# Patient Record
Sex: Female | Born: 1945
Health system: Southern US, Community
[De-identification: ages and names within clinical notes are randomized; demographics above are authoritative.]

## PROBLEM LIST (undated history)

## (undated) DIAGNOSIS — E079 Disorder of thyroid, unspecified: Secondary | ICD-10-CM

## (undated) DIAGNOSIS — T7840XA Allergy, unspecified, initial encounter: Secondary | ICD-10-CM

## (undated) DIAGNOSIS — M1990 Unspecified osteoarthritis, unspecified site: Secondary | ICD-10-CM

## (undated) DIAGNOSIS — C801 Malignant (primary) neoplasm, unspecified: Secondary | ICD-10-CM

## (undated) DIAGNOSIS — J45909 Unspecified asthma, uncomplicated: Secondary | ICD-10-CM

## (undated) DIAGNOSIS — F329 Major depressive disorder, single episode, unspecified: Secondary | ICD-10-CM

## (undated) DIAGNOSIS — F32A Depression, unspecified: Secondary | ICD-10-CM

## (undated) HISTORY — DX: Depression, unspecified: F32.A

## (undated) HISTORY — PX: TONSILLECTOMY: SUR1361

## (undated) HISTORY — DX: Malignant (primary) neoplasm, unspecified: C80.1

## (undated) HISTORY — DX: Unspecified osteoarthritis, unspecified site: M19.90

## (undated) HISTORY — DX: Allergy, unspecified, initial encounter: T78.40XA

## (undated) HISTORY — PX: HAND SURGERY: SHX662

## (undated) HISTORY — DX: Unspecified asthma, uncomplicated: J45.909

## (undated) HISTORY — PX: FINGER SURGERY: SHX640

## (undated) HISTORY — DX: Major depressive disorder, single episode, unspecified: F32.9

## (undated) HISTORY — DX: Disorder of thyroid, unspecified: E07.9

## (undated) HISTORY — PX: ANKLE FRACTURE SURGERY: SHX122

---

## 2000-01-04 ENCOUNTER — Encounter: Admission: RE | Admit: 2000-01-04 | Discharge: 2000-01-04 | Payer: Self-pay | Admitting: Obstetrics and Gynecology

## 2000-01-04 ENCOUNTER — Encounter: Payer: Self-pay | Admitting: Obstetrics and Gynecology

## 2000-02-07 ENCOUNTER — Encounter: Admission: RE | Admit: 2000-02-07 | Discharge: 2000-02-07 | Payer: Self-pay | Admitting: *Deleted

## 2000-02-07 ENCOUNTER — Encounter: Payer: Self-pay | Admitting: *Deleted

## 2001-01-04 ENCOUNTER — Encounter: Payer: Self-pay | Admitting: Family Medicine

## 2001-01-04 ENCOUNTER — Encounter: Admission: RE | Admit: 2001-01-04 | Discharge: 2001-01-04 | Payer: Self-pay | Admitting: Family Medicine

## 2001-06-29 ENCOUNTER — Other Ambulatory Visit: Admission: RE | Admit: 2001-06-29 | Discharge: 2001-06-29 | Payer: Self-pay | Admitting: Family Medicine

## 2002-08-20 ENCOUNTER — Other Ambulatory Visit: Admission: RE | Admit: 2002-08-20 | Discharge: 2002-08-20 | Payer: Self-pay | Admitting: Family Medicine

## 2002-08-29 ENCOUNTER — Encounter: Admission: RE | Admit: 2002-08-29 | Discharge: 2002-08-29 | Payer: Self-pay | Admitting: Family Medicine

## 2002-08-29 ENCOUNTER — Encounter: Payer: Self-pay | Admitting: Family Medicine

## 2004-07-22 ENCOUNTER — Other Ambulatory Visit: Admission: RE | Admit: 2004-07-22 | Discharge: 2004-07-22 | Payer: Self-pay | Admitting: Family Medicine

## 2004-07-28 ENCOUNTER — Ambulatory Visit: Payer: Self-pay | Admitting: Internal Medicine

## 2004-08-09 ENCOUNTER — Ambulatory Visit: Payer: Self-pay | Admitting: Internal Medicine

## 2008-01-14 ENCOUNTER — Encounter: Admission: RE | Admit: 2008-01-14 | Discharge: 2008-01-14 | Payer: Self-pay | Admitting: Family Medicine

## 2008-07-16 ENCOUNTER — Encounter: Payer: Self-pay | Admitting: Cardiovascular Disease

## 2008-07-23 ENCOUNTER — Encounter: Payer: Self-pay | Admitting: Cardiovascular Disease

## 2008-12-17 DIAGNOSIS — E785 Hyperlipidemia, unspecified: Secondary | ICD-10-CM | POA: Insufficient documentation

## 2008-12-17 DIAGNOSIS — E782 Mixed hyperlipidemia: Secondary | ICD-10-CM | POA: Insufficient documentation

## 2008-12-17 DIAGNOSIS — R002 Palpitations: Secondary | ICD-10-CM | POA: Insufficient documentation

## 2008-12-17 DIAGNOSIS — F329 Major depressive disorder, single episode, unspecified: Secondary | ICD-10-CM | POA: Insufficient documentation

## 2008-12-17 DIAGNOSIS — E039 Hypothyroidism, unspecified: Secondary | ICD-10-CM | POA: Insufficient documentation

## 2008-12-17 DIAGNOSIS — I495 Sick sinus syndrome: Secondary | ICD-10-CM | POA: Insufficient documentation

## 2008-12-17 DIAGNOSIS — T7840XA Allergy, unspecified, initial encounter: Secondary | ICD-10-CM | POA: Insufficient documentation

## 2008-12-17 DIAGNOSIS — G43909 Migraine, unspecified, not intractable, without status migrainosus: Secondary | ICD-10-CM | POA: Insufficient documentation

## 2008-12-17 DIAGNOSIS — R0789 Other chest pain: Secondary | ICD-10-CM | POA: Insufficient documentation

## 2008-12-24 ENCOUNTER — Ambulatory Visit: Payer: Self-pay | Admitting: Cardiovascular Disease

## 2008-12-24 DIAGNOSIS — R9431 Abnormal electrocardiogram [ECG] [EKG]: Secondary | ICD-10-CM | POA: Insufficient documentation

## 2008-12-24 DIAGNOSIS — R42 Dizziness and giddiness: Secondary | ICD-10-CM | POA: Insufficient documentation

## 2008-12-25 ENCOUNTER — Telehealth: Payer: Self-pay | Admitting: Cardiology

## 2009-02-28 ENCOUNTER — Inpatient Hospital Stay (HOSPITAL_COMMUNITY): Admission: EM | Admit: 2009-02-28 | Discharge: 2009-03-04 | Payer: Self-pay | Admitting: Emergency Medicine

## 2009-08-05 ENCOUNTER — Ambulatory Visit: Payer: Self-pay | Admitting: Sports Medicine

## 2009-08-05 DIAGNOSIS — M1710 Unilateral primary osteoarthritis, unspecified knee: Secondary | ICD-10-CM

## 2009-08-05 DIAGNOSIS — IMO0002 Reserved for concepts with insufficient information to code with codable children: Secondary | ICD-10-CM | POA: Insufficient documentation

## 2009-08-25 ENCOUNTER — Ambulatory Visit: Payer: Self-pay | Admitting: Family Medicine

## 2009-08-25 DIAGNOSIS — IMO0002 Reserved for concepts with insufficient information to code with codable children: Secondary | ICD-10-CM | POA: Insufficient documentation

## 2009-09-22 ENCOUNTER — Ambulatory Visit: Payer: Self-pay | Admitting: Family Medicine

## 2009-11-26 ENCOUNTER — Encounter: Payer: Self-pay | Admitting: Cardiovascular Disease

## 2009-12-03 ENCOUNTER — Encounter: Payer: Self-pay | Admitting: Cardiovascular Disease

## 2009-12-30 ENCOUNTER — Ambulatory Visit: Payer: Self-pay | Admitting: Cardiovascular Disease

## 2010-01-14 ENCOUNTER — Telehealth (INDEPENDENT_AMBULATORY_CARE_PROVIDER_SITE_OTHER): Payer: Self-pay | Admitting: *Deleted

## 2010-01-18 ENCOUNTER — Encounter: Payer: Self-pay | Admitting: Cardiovascular Disease

## 2010-01-18 ENCOUNTER — Ambulatory Visit: Payer: Self-pay

## 2010-01-18 ENCOUNTER — Ambulatory Visit (HOSPITAL_COMMUNITY): Admission: RE | Admit: 2010-01-18 | Discharge: 2010-01-18 | Payer: Self-pay | Admitting: Cardiovascular Disease

## 2010-01-18 ENCOUNTER — Ambulatory Visit: Payer: Self-pay | Admitting: Internal Medicine

## 2010-02-10 ENCOUNTER — Ambulatory Visit: Payer: Self-pay | Admitting: Cardiovascular Disease

## 2010-03-08 ENCOUNTER — Telehealth (INDEPENDENT_AMBULATORY_CARE_PROVIDER_SITE_OTHER): Payer: Self-pay | Admitting: *Deleted

## 2010-04-29 NOTE — Progress Notes (Signed)
Summary: [facility]   [facility]   Imported By: Roderic Ovens 01/08/2010 15:16:09  _____________________________________________________________________  External Attachment:    Type:   Image     Comment:   External Document

## 2010-04-29 NOTE — Progress Notes (Signed)
Summary: Records Request  Faxed Echo. to Angie at [facility]. at [facility] ([phone]).  Debby Freiberg  March 08, 2010 1:59 PM

## 2010-04-29 NOTE — Assessment & Plan Note (Signed)
Summary: 1 month rov.sl   Visit Type:  1 mko f/u Primary Provider:  Murrell Redden, MD  CC:  sob w/inclines....pt denies any other cardiac complaints today.  History of Present Illness: 65 yo WF with history of migraine headaches, hypothyroidism, skin cancer here today for follow up.I saw her 6 weeks ago and she complained of chest pain at a meeting.   She was asked to talk at this meeting and she felt a weight on her chest. There was associated dizziness and pressure in her head. The pressure in her chest subsided 15 minutes later. There had been no exertional chest pain or pressure. She does have exertional dyspnea when climbing stairs. I ordered a stress echo. She is here today to review the results of the stress test. She tells me that she has had no recurrence of chest pain. She continues to have mild SOB when walking up stairs or inclines. She did have mold in her home discooverd recently. She has a mild cough.    Current Medications (verified): 1)  Atenolol 50 Mg Tabs (Atenolol) .... Take One Tablet By Mouth Daily 2)  Armour Thyroid 30 Mg Tabs (Thyroid) .... Take One Tablet By Mouth Once Daily. 3)  Armour Thyroid 60 Mg Tabs (Thyroid) .... Take One Tablet By Mouth Once Daily. 4)  Garlic Oil 1000 Mg Caps (Garlic) .... Take One Tablet By Mouth Twice Daily. 5)  Acidophilus 10 Mg Caps (Lactobacillus) .... Take One Tablet By Mouth Once Daily. 6)  Fish Oil 1000 Mg Caps (Omega-3 Fatty Acids) .Marland Kitchen.. 1 Cap Once Daily 7)  Calcium/magnesium/zinc Formula Tabs (Calcium-Magnesium-Zinc) .Marland Kitchen.. 1 Tab Once Daily 8)  Vitamin D 1000 Unit  Tabs (Cholecalciferol) .... Take One Tablet By Mouth Once Daily. 9)  Cvs Vitamin E 400 Unit Caps (Vitamin E) .... Take One Tablet By Mouth Once Daily. 10)  Holy Basil Extract 450mg  .... Take One Tablet By Mouth Once Daily. 11)  Silymarin  Caps (Milk Thistle-Turmeric) .Marland Kitchen.. 1 Cap Once Daily 12)  Citalopram Hydrobromide 40 Mg Tabs (Citalopram Hydrobromide) .Marland Kitchen.. 1 Tab Once  Daily  Allergies (verified): No Known Drug Allergies  Past History:  Past Medical History: Reviewed history from 12/24/2008 and no changes required. RBBB/ INCOMPLETE (ICD-426.4) SINUS BRADYCARDIA (ICD-427.81) CHEST PAIN, ATYPICAL (ICD-786.59) DEPRESSION (ICD-311) HYPOTHYROIDISM (ICD-244.9) ALLERGY (ICD-995.3) MIGRAINE HEADACHE (ICD-346.90) Asthma Arthritis    Social History: Reviewed history from 12/24/2008 and no changes required. Tobacco Use - No.  No illicit drugs Social alcohol Separated 2 children  Review of Systems       The patient complains of shortness of breath.  The patient denies fatigue, malaise, fever, weight gain/loss, vision loss, decreased hearing, hoarseness, chest pain, palpitations, prolonged cough, wheezing, sleep apnea, coughing up blood, abdominal pain, blood in stool, nausea, vomiting, diarrhea, heartburn, incontinence, blood in urine, muscle weakness, joint pain, leg swelling, rash, skin lesions, headache, fainting, dizziness, depression, anxiety, enlarged lymph nodes, easy bruising or bleeding, and environmental allergies.    Vital Signs:  Patient profile:   65 year old female Height:      63 inches Weight:      157 pounds BMI:     27.91 Pulse rate:   62 / minute Pulse rhythm:   irregular BP sitting:   98 / 66  (left arm) Cuff size:   large  Vitals Entered By: [name], CMA (February 10, 2010 2:34 PM)  Physical Exam  General:  General: Well developed, well nourished, NAD Musculoskeletal: Muscle strength 5/5 all ext Psychiatric: Mood  and affect normal Lungs:Clear bilaterally, no wheezes, rhonci, crackles CV: RRR no murmurs, gallops rubs Abdomen: soft, NT, ND, BS present Extremities: No edema, pulses 2+.    Stress Echocardiogram  Procedure date:  01/18/2010  Findings:      Good exercise tolerance. (7 min 58 sec). Normal LV function before and after exercise. No ischemic EKG changes.   Impression & Recommendations:  Problem #  1:  CHEST PAIN, ATYPICAL (ICD-786.59) Stress echo without evidence of ischemia. No further cardiac workup necessary. Her episode of chest pain was most likely anxiety related.   Her updated medication list for this problem includes:    Atenolol 50 Mg Tabs (Atenolol) .Marland Kitchen... Take one tablet by mouth daily  Patient Instructions: 1)  Your physician recommends that you schedule a follow-up appointment in: as needed.

## 2010-04-29 NOTE — Assessment & Plan Note (Signed)
Summary: F/U,MC   Vital Signs:  Patient profile:   65 year old female BP sitting:   115 / 76  Vitals Entered By: Lillia Pauls CMA (September 22, 2009 3:19 PM)  History of Present Illness: 65 year old female recently seen by Dr. Darrick Penna with L knee pain s/p corticosteroid injection last month, now in f/u  at this point, she is only having pain at the pes anserine bursitis  on the left side, and she is now approximately 80% better. She is not having any intra-articular pain at this point.  REVIEW OF SYSTEMS  GEN: No systemic complaints, no fevers, chills, sweats, or other acute illnesses MSK: Detailed in the HPI GI: tolerating PO intake without difficulty Neuro: No numbness, parasthesias, or tingling associated. Otherwise the pertinent positives of the ROS are noted above.    GEN: Well-developed,well-nourished,in no acute distress; alert,appropriate and cooperative throughout examination HEENT: Normocephalic and atraumatic without obvious abnormalities. No apparent alopecia or balding. Ears, externally no deformities PULM: Breathing comfortably in no respiratory distress EXT: No clubbing, cyanosis, or edema PSYCH: Normally interactive. Cooperative during the interview. Pleasant. Friendly and conversant. Not anxious or depressed appearing. Normal, full affect.   Left knee: Full range of motion. Nontender throughout range of motion. Nontender on the joint lines. She is tender at thepes anserine bursitis, but only mildly  No tenderness at the patellar tendon or quadriceps tendon.  Allergies: No Known Drug Allergies   Impression & Recommendations:  Problem # 1:  ANSERINE BURSITIS, LEFT (ICD-726.61) mostly improved pes anserine bursitis. Continue with strengthening, range of motion, and icing as needed.  Follow up as needed  Complete Medication List: 1)  Atenolol 50 Mg Tabs (Atenolol) .... Take one tablet by mouth daily 2)  Armour Thyroid 30 Mg Tabs (Thyroid) .... Take one tablet by  mouth once daily. 3)  Armour Thyroid 60 Mg Tabs (Thyroid) .... Take one tablet by mouth once daily. 4)  Garlic Oil 1000 Mg Caps (Garlic) .... Take one tablet by mouth twice daily. 5)  Acidophilus 10 Mg Caps (Lactobacillus) .... Take one tablet by mouth once daily. 6)  Fish Oil Oil (Fish oil) .... Take one tablet by mouth once daily. 7)  Calcium/magnesium/zinc Formula Tabs (calcium-magnesium-zinc)  8)  Vitamin D 1000 Unit Tabs (Cholecalciferol) .... Take one tablet by mouth once daily. 9)  Cvs Vitamin E 400 Unit Caps (Vitamin e) .... Take one tablet by mouth once daily. 10)  Holy Basil Extract 450mg   .... Take one tablet by mouth once daily.

## 2010-04-29 NOTE — Assessment & Plan Note (Signed)
Summary: KNEE PAIN,MC   Vital Signs:  Patient profile:   65 year old female BP sitting:   112 / 71  Vitals Entered By: Lillia Pauls CMA (Aug 25, 2009 2:04 PM)  History of Present Illness: 65 year old female recently seen by Dr. Darrick Penna with L knee pain s/p corticosteroid injection several weeks ago here in follow-up.  Has had a lot of pain, injection a couple of weeks ago, felt really good for about three days. Then pain up on the bottom. Now pain is in the lower tibial area.    now patient is having the region just below her joint line about 2 inches below  the painful. There is mild swelling. She has not had any specific trauma or injury that she can recall. She go to a wedding this weekend in [place].  REVIEW OF SYSTEMS  GEN: No systemic complaints, no fevers, chills, sweats, or other acute illnesses MSK: Detailed in the HPI GI: tolerating PO intake without difficulty Neuro: No numbness, parasthesias, or tingling associated. Otherwise the pertinent positives of the ROS are noted above.    GEN: Well-developed,well-nourished,in no acute distress; alert,appropriate and cooperative throughout examination HEENT: Normocephalic and atraumatic without obvious abnormalities. No apparent alopecia or balding. Ears, externally no deformities PULM: Breathing comfortably in no respiratory distress EXT: No clubbing, cyanosis, or edema PSYCH: Normally interactive. Cooperative during the interview. Pleasant. Friendly and conversant. Not anxious or depressed appearing. Normal, full affect.   Left knee: Full range of motion. Nontender throughout range of motion. Nontender on the joint lines. She is tender at the present serene bursa, and does have pain with  resisted external rotation hip with her knee bend at 90.  No tenderness at the patellar tendon or quadriceps tendon.  Allergies: No Known Drug Allergies   Impression & Recommendations:  Problem # 1:  ANSERINE BURSITIS, LEFT  (ICD-726.61) Assessment New think this is all secondary likely due to gait disturbance.  Think improbable to  any relation to her intra-articular involvement.  Status post corticosteroid injection approximately 10 days ago. No history of trauma.  Less likely, could be stress reaction in the tibial plateau, however she is mostly tender right at the pes anserine bursitis  Management certainly, anti-inflammatories, and ice massage.  Reviewed range of motion and of all the muscles involved at this insertion point. Discussed use of Voltaren gel but  the patient is using in  herbal and inflammatory product from [place] with some success, so I thought that she could do this without any potential harm and continue conservatively. f/u 4 weeks  Complete Medication List: 1)  Atenolol 50 Mg Tabs (Atenolol) .... Take one tablet by mouth daily 2)  Armour Thyroid 30 Mg Tabs (Thyroid) .... Take one tablet by mouth once daily. 3)  Armour Thyroid 60 Mg Tabs (Thyroid) .... Take one tablet by mouth once daily. 4)  Garlic Oil 1000 Mg Caps (Garlic) .... Take one tablet by mouth twice daily. 5)  Acidophilus 10 Mg Caps (Lactobacillus) .... Take one tablet by mouth once daily. 6)  Fish Oil Oil (Fish oil) .... Take one tablet by mouth once daily. 7)  Calcium/magnesium/zinc Formula Tabs (calcium-magnesium-zinc)  8)  Vitamin D 1000 Unit Tabs (Cholecalciferol) .... Take one tablet by mouth once daily. 9)  Cvs Vitamin E 400 Unit Caps (Vitamin e) .... Take one tablet by mouth once daily. 10)  Holy Basil Extract 450mg   .... Take one tablet by mouth once daily.

## 2010-04-29 NOTE — Letter (Signed)
Summary: [facility] Referral form  [facility] Referral form   Imported By: Marily Memos 08/05/2009 10:37:50  _____________________________________________________________________  External Attachment:    Type:   Image     Comment:   External Document

## 2010-04-29 NOTE — Assessment & Plan Note (Signed)
Summary: NP KNEE PAIN/MJD   Vital Signs:  Patient profile:   65 year old female BP sitting:   130 / 80  Vitals Entered By: Lillia Pauls CMA (Aug 05, 2009 8:58 AM)  Primary Provider:  Murrell Redden, MD   History of Present Illness: Hx of left knee DJD. Previously followed at [facility]. Last received a corticosteroid injection 3 yrs ago with signficant pain relief. No left knee injuries or surgeries. No resting pain or paresthesias. Pain worst on prolonged walking, deep flexion, and stair ascension. Pain and swelling have gradually increased over past several weeks. No inciting event. Pain level of 8/10 recently. No locking/popping/catching/buckling.  note has done yoga w nancy thrornton and would like to return to this if possible  advised she may need to modify knee bend to accomplish this once pain settles  Allergies: No Known Drug Allergies  Physical Exam  General:  Well-developed,well-nourished,in no acute distress; alert,appropriate and cooperative throughout examination Msk:  Left KNEE: Mild diffuse swelling. No discoloration or increased warmth. TTP worst along supero-lateral aspect of patella. Less ttp of med joint line and pes bursa. Active ROM of 0 to 100 with pain on end flexion. No signs of locking on exam. Normal ligament stability. (-) McMurray's though pt slightly guarding. Normal nv examination.   GAIT: No functional leg leg discrepancy. Slightly favoring LLE today. Extremities:  Equal leg lengths.   Impression & Recommendations:  Problem # 1:  DEGENERATIVE JOINT DISEASE, KNEE (ICD-715.96) Possible underlying meniscal tear though no frank signs on exam.  After obtaining informed verbal consent from the patient, the infero-lateral aspect of the anterior knee was prepped with alcohol and betadine. Ethyl chloride was used to anesthetize the skin. A 11ml:2ml mixture of lidocaine and kenalog 40mg /ml was injected into the  infero-lateral aspect of the anterior left knee without complications or difficulty. The patient tolerated this procedure well.  - Formal Physical Therapy. - Daily SLR at home. - Focus more on recumbent biking and elliptical. - Supportive shoes. - Caution regarding physical activities which significantly load the knees. - Immediately seek MD attention for fever, knee discoloration, increased pain/swelling, or any other concerns. Otherwise RTC in 8 wks.  Orders: Joint Aspirate / Injection, Large (20610) Kenalog 10 mg inj (J3301)  Complete Medication List: 1)  Atenolol 50 Mg Tabs (Atenolol) .... Take one tablet by mouth daily 2)  Armour Thyroid 30 Mg Tabs (Thyroid) .... Take one tablet by mouth once daily. 3)  Armour Thyroid 60 Mg Tabs (Thyroid) .... Take one tablet by mouth once daily. 4)  Garlic Oil 1000 Mg Caps (Garlic) .... Take one tablet by mouth twice daily. 5)  Acidophilus 10 Mg Caps (Lactobacillus) .... Take one tablet by mouth once daily. 6)  Fish Oil Oil (Fish oil) .... Take one tablet by mouth once daily. 7)  Calcium/magnesium/zinc Formula Tabs (calcium-magnesium-zinc)  8)  Vitamin D 1000 Unit Tabs (Cholecalciferol) .... Take one tablet by mouth once daily. 9)  Cvs Vitamin E 400 Unit Caps (Vitamin e) .... Take one tablet by mouth once daily. 10)  Holy Basil Extract 450mg   .... Take one tablet by mouth once daily.

## 2010-04-29 NOTE — Assessment & Plan Note (Signed)
Summary: ROV/CHEST PAIN/JML   Visit Type:  rov Primary Sheri Ray:  Sheri Redden, MD  CC:  chest pressure...sob....denies any edema.  History of Present Illness: 65 yo WF with history of migraine headaches, hypothyroidism, skin cancer here today for follow up. She was seen last year after an episode of chest pain. She was doing her activities of daily living and became dizzy, mild chest pressure that lasted for 30 seconds, associated with mild SOB but no dizziness, diaphoresis or nausea, no radiation. I ordered an echo but she cancelled this appt. She is here today for follow up and tells me that has done well until two weeks ago when she was out eating and felt agitated. She was asked to talk at this meeting and she felt a weight on her chest. There was associated dizziness and pressure in her head. The pressure in her chest subsided 15 minutes later. There has been no exertional chest pain or pressure. She does have exertional dyspnea when climbing stairs.   Current Medications (verified): 1)  Atenolol 50 Mg Tabs (Atenolol) .... Take One Tablet By Mouth Daily 2)  Armour Thyroid 30 Mg Tabs (Thyroid) .... Take One Tablet By Mouth Once Daily. 3)  Armour Thyroid 60 Mg Tabs (Thyroid) .... Take One Tablet By Mouth Once Daily. 4)  Garlic Oil 1000 Mg Caps (Garlic) .... Take One Tablet By Mouth Twice Daily. 5)  Acidophilus 10 Mg Caps (Lactobacillus) .... Take One Tablet By Mouth Once Daily. 6)  Fish Oil   Oil (Fish Oil) .... Take One Tablet By Mouth Once Daily. 7)  Calcium/magnesium/zinc Formula Tabs (Calcium-Magnesium-Zinc) 8)  Vitamin D 1000 Unit  Tabs (Cholecalciferol) .... Take One Tablet By Mouth Once Daily. 9)  Cvs Vitamin E 400 Unit Caps (Vitamin E) .... Take One Tablet By Mouth Once Daily. 10)  Holy Basil Extract 450mg  .... Take One Tablet By Mouth Once Daily.  Allergies (verified): No Known Drug Allergies  Past History:  Past Medical History: Reviewed history from 12/24/2008 and no  changes required. RBBB/ INCOMPLETE (ICD-426.4) SINUS BRADYCARDIA (ICD-427.81) CHEST PAIN, ATYPICAL (ICD-786.59) DEPRESSION (ICD-311) HYPOTHYROIDISM (ICD-244.9) ALLERGY (ICD-995.3) MIGRAINE HEADACHE (ICD-346.90) Asthma Arthritis    Social History: Reviewed history from 12/24/2008 and no changes required. Tobacco Use - No.  No illicit drugs Social alcohol Separated 2 children  Review of Systems       The patient complains of chest pain.  The patient denies fatigue, malaise, fever, weight gain/loss, vision loss, decreased hearing, hoarseness, palpitations, shortness of breath, prolonged cough, wheezing, sleep apnea, coughing up blood, abdominal pain, blood in stool, nausea, vomiting, diarrhea, heartburn, incontinence, blood in urine, muscle weakness, joint pain, leg swelling, rash, skin lesions, headache, fainting, dizziness, depression, anxiety, enlarged lymph nodes, easy bruising or bleeding, and environmental allergies.    Vital Signs:  Patient profile:   65 year old female Height:      63 inches Weight:      151.12 pounds BMI:     26.87 Pulse rate:   59 / minute Pulse rhythm:   irregular BP sitting:   106 / 70  (left arm) Cuff size:   regular  Vitals Entered By: [name], CMA (December 30, 2009 3:37 PM)  Physical Exam  General:  General: Well developed, well nourished, NAD HEENT: OP clear, mucus membranes moist SKIN: warm, dry Neuro: No focal deficits Musculoskeletal: Muscle strength 5/5 all ext Psychiatric: Mood and affect normal Neck: No JVD, no carotid bruits, no thyromegaly, no lymphadenopathy. Lungs:Clear bilaterally, no wheezes,  rhonci, crackles CV: RRR no murmurs, gallops rubs Abdomen: soft, NT, ND, BS present Extremities: No edema, pulses 2+.    EKG  Procedure date:  12/30/2009  Findings:      sinus bradycardia, rate 59 bpm. Incomplete RBBB.   Impression & Recommendations:  Problem # 1:  CHEST PAIN, ATYPICAL (ICD-786.59) Most likely  non-cardiac in etiology. Her father had CAD diagnosed in his 34s. She has been under much stress. Will arrange stress echo to assess fo ischemia.   Her updated medication list for this problem includes:    Atenolol 50 Mg Tabs (Atenolol) .Marland Kitchen... Take one tablet by mouth daily  Orders: EKG w/ Interpretation (93000) Stress Echo (Stress Echo)  Patient Instructions: 1)  Your physician recommends that you schedule a follow-up appointment in: 3-4 weeks. 2)  Your physician recommends that you continue on your current medications as directed. Please refer to the Current Medication list given to you today. 3)  Your physician has requested that you have a stress echocardiogram. For further information please visit [email or web address]  Please follow instruction sheet as given.

## 2010-04-29 NOTE — Progress Notes (Signed)
Summary: Stress Echo Pre-Procedure  Phone Note Outgoing Call   Call placed by: Antionette Char RN,  January 14, 2010 5:13 PM Call placed to: Patient Reason for Call: Confirm/change Appt Summary of Call: Left message with instructions for Stress Echo. Instructions given to hold Atenolol.

## 2010-04-29 NOTE — Progress Notes (Signed)
Summary: [facility] Assessment   [facility] Assessment   Imported By: Roderic Ovens 01/08/2010 15:14:36  _____________________________________________________________________  External Attachment:    Type:   Image     Comment:   External Document

## 2010-06-29 LAB — DIFFERENTIAL
Basophils Absolute: 0 K/uL (ref 0.0–0.1)
Basophils Relative: 0 % (ref 0–1)
Eosinophils Absolute: 0.2 K/uL (ref 0.0–0.7)
Eosinophils Relative: 4 % (ref 0–5)
Lymphocytes Relative: 21 % (ref 12–46)
Lymphs Abs: 1.4 10*3/uL (ref 0.7–4.0)
Monocytes Absolute: 0.4 10*3/uL (ref 0.1–1.0)
Monocytes Relative: 5 % (ref 3–12)
Neutro Abs: 4.5 10*3/uL (ref 1.7–7.7)
Neutrophils Relative %: 69 % (ref 43–77)

## 2010-06-29 LAB — CBC
HCT: 34.1 % — ABNORMAL LOW (ref 36.0–46.0)
HCT: 40.2 % (ref 36.0–46.0)
Hemoglobin: 12 g/dL (ref 12.0–15.0)
Hemoglobin: 13.8 g/dL (ref 12.0–15.0)
MCHC: 34.2 g/dL (ref 30.0–36.0)
MCHC: 35.1 g/dL (ref 30.0–36.0)
MCHC: 35.5 g/dL (ref 30.0–36.0)
MCV: 84.3 fL (ref 78.0–100.0)
MCV: 85.3 fL (ref 78.0–100.0)
Platelets: 180 K/uL (ref 150–400)
RBC: 4.01 MIL/uL (ref 3.87–5.11)
RBC: 4.71 MIL/uL (ref 3.87–5.11)
RDW: 14.1 % (ref 11.5–15.5)
RDW: 14.1 % (ref 11.5–15.5)
RDW: 14.2 % (ref 11.5–15.5)
WBC: 6.5 10*3/uL (ref 4.0–10.5)

## 2010-12-07 ENCOUNTER — Ambulatory Visit (INDEPENDENT_AMBULATORY_CARE_PROVIDER_SITE_OTHER): Payer: BC Managed Care – PPO | Admitting: Sports Medicine

## 2010-12-07 VITALS — BP 115/74 | Ht 62.5 in | Wt 150.0 lb

## 2010-12-07 DIAGNOSIS — M7750 Other enthesopathy of unspecified foot: Secondary | ICD-10-CM

## 2010-12-07 DIAGNOSIS — M79673 Pain in unspecified foot: Secondary | ICD-10-CM | POA: Insufficient documentation

## 2010-12-07 DIAGNOSIS — M7740 Metatarsalgia, unspecified foot: Secondary | ICD-10-CM | POA: Insufficient documentation

## 2010-12-07 DIAGNOSIS — M79609 Pain in unspecified limb: Secondary | ICD-10-CM

## 2010-12-07 NOTE — Progress Notes (Signed)
  Subjective:    Patient ID: Sheri Ray, female    DOB: [birth year], 65 y.o.   MRN: [id number]  HPI For last 6 months, pt has been having numbness on ball of great toe bilaterally. Pt also c/o point tenderness along metatarsals on both feet.  Pt has cramping in her toes.  She has been walking for exercise.  Pt recently bought hard plastic OTC orthotics and did feel better but still has forefoot pain and can't use these in some shoes.  Plans to walk 5 miles with her son Sheri Ray [facility]) this weekend   Review of Systems     Objective:   Physical Exam NAD  R foot:collapse of long arch and transverse arch, Morton's callous; calcaneal valgus on R ; R ankle is subluxed when pt is standing; spurring at 5th MTP; good great toe motion  L Foot:neutral calcaneus, mild breakdown of long arch (not as much as R), loss of transverse arch and Morton's callous; spurring at 5th MTP; good great toe motion       Assessment & Plan:  Pt's nerve pain, cramping and great toe numbness are mostly due to breakdown of pt's R long arch and breakdown of bilateral transverse arch. - pt given green inserts with scaphoid pad on R and metatarsal pads on both feet -pt given add'l metatarsal pads for other shoes  Pt should try these out for 4-6 weeks and if she wants something more permanent, please feel free to return for custom orthotics.

## 2010-12-07 NOTE — Assessment & Plan Note (Signed)
For her walking shoes we made a temporary sports insole with arch support on the right  We also added metatarsal pads to both  She felt better support with this and is going to try to break these in over the next 3 weeks

## 2010-12-07 NOTE — Assessment & Plan Note (Signed)
We tried placing metatarsal pads in a couple of her other shoes  If this strategy works we want to place metatarsal pads and all of her regular shoes

## 2012-08-02 ENCOUNTER — Other Ambulatory Visit: Payer: Self-pay | Admitting: Family Medicine

## 2012-08-02 ENCOUNTER — Other Ambulatory Visit (HOSPITAL_COMMUNITY)
Admission: RE | Admit: 2012-08-02 | Discharge: 2012-08-02 | Disposition: A | Discharge status: Home / Self Care | Payer: Medicare Other | Source: Ambulatory Visit | Attending: Family Medicine | Admitting: Family Medicine

## 2012-08-02 DIAGNOSIS — Z124 Encounter for screening for malignant neoplasm of cervix: Secondary | ICD-10-CM | POA: Insufficient documentation

## 2012-08-02 DIAGNOSIS — Z1151 Encounter for screening for human papillomavirus (HPV): Secondary | ICD-10-CM | POA: Insufficient documentation

## 2013-06-10 ENCOUNTER — Ambulatory Visit (INDEPENDENT_AMBULATORY_CARE_PROVIDER_SITE_OTHER): Payer: Medicare Other | Admitting: Podiatry

## 2013-06-10 ENCOUNTER — Encounter: Payer: Self-pay | Admitting: Podiatry

## 2013-06-10 DIAGNOSIS — Q828 Other specified congenital malformations of skin: Secondary | ICD-10-CM

## 2013-06-10 NOTE — Progress Notes (Signed)
° °  Subjective:    Patient ID: Sheri Ray, female    DOB: 1945-03-31, 68 y.o.   MRN: [id number]  HPI I have these places on the balls of both feet and has been going on for about 5 years and hurts with shoes. This patient presents for ongoing debridement of painful porokeratoses on the right and left feet. The last visit for this similar service was 12/17/2012. She has been a patient in this practice since 1996.    Review of Systems  Constitutional: Positive for fatigue.  Allergic/Immunologic: Positive for food allergies.  Neurological: Positive for headaches.  Hematological: Bruises/bleeds easily.  All other systems reviewed and are negative.       Objective:   Physical Exam  Multiple punctate keratoses on the plantar aspect of right and left feet.          Assessment & Plan:   Assessment: Porokeratoses multiple right and left feet  Plan: Keratoses debrided and packed with salinocaine. Reappoint at patient's request

## 2013-06-17 ENCOUNTER — Ambulatory Visit: Payer: Self-pay | Admitting: Podiatry

## 2013-08-26 ENCOUNTER — Encounter: Payer: Self-pay | Admitting: Podiatry

## 2013-08-26 ENCOUNTER — Ambulatory Visit (INDEPENDENT_AMBULATORY_CARE_PROVIDER_SITE_OTHER): Payer: Medicare Other | Admitting: Podiatry

## 2013-08-26 VITALS — BP 132/74 | HR 55 | Resp 15 | Ht 62.5 in | Wt 142.0 lb

## 2013-08-26 DIAGNOSIS — Q828 Other specified congenital malformations of skin: Secondary | ICD-10-CM

## 2013-08-26 NOTE — Progress Notes (Signed)
Patient ID: Sheri Ray, female   DOB: 1945-07-25, 68 y.o.   MRN: [id number]  Subjective: This patient presents complaining of painful multiple plantar keratoses right and left feet  Objective: Multiple punctate nucleated keratoses plantar right and left feet  Assessment: Porokeratoses multiple  Plan: Debrided the multiple keratoses and packed with salinocaine  Reappoint at patient's request

## 2013-10-28 ENCOUNTER — Encounter: Payer: Self-pay | Admitting: Podiatry

## 2013-10-28 ENCOUNTER — Ambulatory Visit (INDEPENDENT_AMBULATORY_CARE_PROVIDER_SITE_OTHER): Payer: Medicare Other | Admitting: Podiatry

## 2013-10-28 VITALS — BP 123/74 | HR 86 | Resp 12

## 2013-10-28 DIAGNOSIS — Q828 Other specified congenital malformations of skin: Secondary | ICD-10-CM

## 2013-10-29 NOTE — Progress Notes (Signed)
Patient ID: Sheri Ray, female   DOB: 1945-09-25, 68 y.o.   MRN: [id number]  Subjective: This patient presents today complaining of painful nucleated plantar keratoses  Objective: Multiple nucleated plantar keratoses right and left  Assessment: Porokeratosis multiple 2-4  Plan: Debride keratoses and pack with salinocaine   Reappoint at patient's request

## 2014-02-10 ENCOUNTER — Ambulatory Visit (INDEPENDENT_AMBULATORY_CARE_PROVIDER_SITE_OTHER): Payer: Medicare Other | Admitting: Podiatry

## 2014-02-10 ENCOUNTER — Encounter: Payer: Self-pay | Admitting: Podiatry

## 2014-02-10 DIAGNOSIS — Q828 Other specified congenital malformations of skin: Secondary | ICD-10-CM

## 2014-02-11 NOTE — Progress Notes (Signed)
Patient ID: Sheri Ray, female   DOB: 1945/10/25, 68 y.o.   MRN: [id number]  Subjective: This patient presents again complaining of painful multiple nucleated plantar keratoses bilaterally  Objective: Multiple plantar nucleated keratoses right and left  Assessment: Porokeratosis multiple  Plan: Debrided keratoses and packed with salinocaine  Reappoint at patient's request

## 2014-02-19 ENCOUNTER — Ambulatory Visit (INDEPENDENT_AMBULATORY_CARE_PROVIDER_SITE_OTHER): Payer: Medicare Other | Admitting: Podiatry

## 2014-02-19 ENCOUNTER — Encounter: Payer: Self-pay | Admitting: Podiatry

## 2014-02-19 VITALS — BP 110/66 | HR 60 | Resp 12

## 2014-02-19 DIAGNOSIS — Q828 Other specified congenital malformations of skin: Secondary | ICD-10-CM

## 2014-02-19 NOTE — Progress Notes (Signed)
Patient ID: Sheri Ray, female   DOB: 1945/11/08, 68 y.o.   MRN: [id number]  Subjective: This patient presents after the visit of 02/10/2014 for debridement of a nucleated plantar keratoses. She's had minimal relief primarily localized to the plantar left foot  Objective: Residual punctate keratoses plantar second MPJ  Assessment: Porokeratosis 1  Plan: Debrided keratoses 1 in back with salinocaine  Reappoint at patient's request

## 2014-05-07 ENCOUNTER — Ambulatory Visit (INDEPENDENT_AMBULATORY_CARE_PROVIDER_SITE_OTHER): Payer: Medicare Other | Admitting: Podiatry

## 2014-05-07 ENCOUNTER — Encounter: Payer: Self-pay | Admitting: Podiatry

## 2014-05-07 DIAGNOSIS — Q828 Other specified congenital malformations of skin: Secondary | ICD-10-CM

## 2014-05-07 NOTE — Progress Notes (Signed)
   Subjective:    Patient ID: Sheri Ray, female    DOB: Aug 30, 1945, 69 y.o.   MRN: [id number]  HPI Pt presents for painful callus trim, left foot   Review of Systems     Objective:   Physical Exam  Orientated 3 Nucleated plantar keratoses sub-second left MPJ      Assessment & Plan:   Assessment: Porokeratosis 1  Plan: Debridement of keratoses 1 and packed with salinocaine  Reappoint at patient's request

## 2014-06-18 ENCOUNTER — Encounter: Payer: Self-pay | Admitting: Internal Medicine

## 2014-09-01 ENCOUNTER — Encounter: Payer: Self-pay | Admitting: Internal Medicine

## 2014-09-14 ENCOUNTER — Emergency Department (HOSPITAL_COMMUNITY)
Admission: EM | Admit: 2014-09-14 | Discharge: 2014-09-14 | Disposition: A | Discharge status: Other Acute Inpatient Hospital | Payer: Medicare Other | Source: Home / Self Care | Attending: Emergency Medicine | Admitting: Emergency Medicine

## 2014-09-14 ENCOUNTER — Encounter (HOSPITAL_COMMUNITY): Payer: Self-pay

## 2014-09-14 ENCOUNTER — Emergency Department (HOSPITAL_COMMUNITY)
Admission: EM | Admit: 2014-09-14 | Discharge: 2014-09-14 | Disposition: A | Discharge status: Home / Self Care | Payer: Medicare Other | Attending: Emergency Medicine | Admitting: Emergency Medicine

## 2014-09-14 DIAGNOSIS — M1990 Unspecified osteoarthritis, unspecified site: Secondary | ICD-10-CM | POA: Insufficient documentation

## 2014-09-14 DIAGNOSIS — M79641 Pain in right hand: Secondary | ICD-10-CM | POA: Diagnosis not present

## 2014-09-14 DIAGNOSIS — E079 Disorder of thyroid, unspecified: Secondary | ICD-10-CM | POA: Insufficient documentation

## 2014-09-14 DIAGNOSIS — Z859 Personal history of malignant neoplasm, unspecified: Secondary | ICD-10-CM | POA: Diagnosis not present

## 2014-09-14 DIAGNOSIS — E871 Hypo-osmolality and hyponatremia: Secondary | ICD-10-CM

## 2014-09-14 DIAGNOSIS — E860 Dehydration: Secondary | ICD-10-CM | POA: Diagnosis present

## 2014-09-14 DIAGNOSIS — M79642 Pain in left hand: Secondary | ICD-10-CM | POA: Diagnosis not present

## 2014-09-14 DIAGNOSIS — Z79899 Other long term (current) drug therapy: Secondary | ICD-10-CM | POA: Insufficient documentation

## 2014-09-14 DIAGNOSIS — M25542 Pain in joints of left hand: Secondary | ICD-10-CM

## 2014-09-14 DIAGNOSIS — M25541 Pain in joints of right hand: Secondary | ICD-10-CM

## 2014-09-14 DIAGNOSIS — R001 Bradycardia, unspecified: Secondary | ICD-10-CM | POA: Diagnosis not present

## 2014-09-14 LAB — COMPREHENSIVE METABOLIC PANEL
ALT: 16 U/L (ref 14–54)
ANION GAP: 6 (ref 5–15)
AST: 26 U/L (ref 15–41)
Albumin: 3 g/dL — ABNORMAL LOW (ref 3.5–5.0)
Alkaline Phosphatase: 45 U/L (ref 38–126)
BILIRUBIN TOTAL: 1.1 mg/dL (ref 0.3–1.2)
CO2: 23 mmol/L (ref 22–32)
Calcium: 7.7 mg/dL — ABNORMAL LOW (ref 8.9–10.3)
Chloride: 92 mmol/L — ABNORMAL LOW (ref 101–111)
Creatinine, Ser: 0.57 mg/dL (ref 0.44–1.00)
GFR calc Af Amer: >60 mL/min (ref >60)
Glucose, Bld: 99 mg/dL (ref 65–99)
Potassium: 4.1 mmol/L (ref 3.5–5.1)
Sodium: 121 mmol/L — ABNORMAL LOW (ref 135–145)
Total Protein: 6.2 g/dL — ABNORMAL LOW (ref 6.5–8.1)

## 2014-09-14 LAB — POCT I-STAT, CHEM 8
BUN: 6 mg/dL (ref 6–20)
CHLORIDE: 91 mmol/L — AB (ref 101–111)
Calcium, Ion: 1.08 mmol/L — ABNORMAL LOW (ref 1.13–1.30)
Creatinine, Ser: 0.6 mg/dL (ref 0.44–1.00)
GLUCOSE: 104 mg/dL — AB (ref 65–99)
HCT: 36 % (ref 36.0–46.0)
HEMOGLOBIN: 12.2 g/dL (ref 12.0–15.0)
Potassium: 4 mmol/L (ref 3.5–5.1)
Sodium: 122 mmol/L — ABNORMAL LOW (ref 135–145)
TCO2: 21 mmol/L (ref 0–100)

## 2014-09-14 LAB — URIC ACID: Uric Acid, Serum: 3.1 mg/dL (ref 2.3–6.6)

## 2014-09-14 LAB — CBC WITH DIFFERENTIAL/PLATELET
Basophils Absolute: 0 10*3/uL (ref 0.0–0.1)
Basophils Relative: 0 % (ref 0–1)
EOS ABS: 0 10*3/uL (ref 0.0–0.7)
EOS PCT: 1 % (ref 0–5)
HEMATOCRIT: 32.5 % — AB (ref 36.0–46.0)
Hemoglobin: 11.2 g/dL — ABNORMAL LOW (ref 12.0–15.0)
Lymphocytes Relative: 9 % — ABNORMAL LOW (ref 12–46)
Lymphs Abs: 0.5 10*3/uL — ABNORMAL LOW (ref 0.7–4.0)
MCH: 27.6 pg (ref 26.0–34.0)
MCHC: 34.5 g/dL (ref 30.0–36.0)
MCV: 80 fL (ref 78.0–100.0)
MONO ABS: 0.2 10*3/uL (ref 0.1–1.0)
MONOS PCT: 4 % (ref 3–12)
Neutro Abs: 5.1 10*3/uL (ref 1.7–7.7)
Neutrophils Relative %: 86 % — ABNORMAL HIGH (ref 43–77)
Platelets: 124 10*3/uL — ABNORMAL LOW (ref 150–400)
RBC: 4.06 MIL/uL (ref 3.87–5.11)
RDW: 14.4 % (ref 11.5–15.5)
WBC: 5.9 10*3/uL (ref 4.0–10.5)

## 2014-09-14 LAB — CK: CK TOTAL: 61 U/L (ref 38–234)

## 2014-09-14 LAB — I-STAT CHEM 8, ED
BUN: 4 mg/dL — AB (ref 6–20)
CHLORIDE: 93 mmol/L — AB (ref 101–111)
CREATININE: 0.5 mg/dL (ref 0.44–1.00)
Calcium, Ion: 1.14 mmol/L (ref 1.13–1.30)
Glucose, Bld: 172 mg/dL — ABNORMAL HIGH (ref 65–99)
HEMATOCRIT: 36 % (ref 36.0–46.0)
Hemoglobin: 12.2 g/dL (ref 12.0–15.0)
Potassium: 3.9 mmol/L (ref 3.5–5.1)
Sodium: 128 mmol/L — ABNORMAL LOW (ref 135–145)
TCO2: 22 mmol/L (ref 0–100)

## 2014-09-14 LAB — TROPONIN I: Troponin I: <0.03 ng/mL (ref <0.031)

## 2014-09-14 LAB — I-STAT CG4 LACTIC ACID, ED: LACTIC ACID, VENOUS: 0.5 mmol/L (ref 0.5–2.0)

## 2014-09-14 MED ORDER — ONDANSETRON 4 MG PO TBDP
4.0000 mg | ORAL_TABLET | Freq: Once | ORAL | Status: AC
  Administered 2014-09-14: 4 mg via ORAL

## 2014-09-14 MED ORDER — SODIUM CHLORIDE 0.9 % IV SOLN
Freq: Once | INTRAVENOUS | Status: AC
  Administered 2014-09-14: 16:00:00 via INTRAVENOUS

## 2014-09-14 MED ORDER — SODIUM CHLORIDE 0.9 % IV BOLUS (SEPSIS)
500.0000 mL | Freq: Once | INTRAVENOUS | Status: AC
  Administered 2014-09-14: 500 mL via INTRAVENOUS

## 2014-09-14 MED ORDER — ONDANSETRON 4 MG PO TBDP
ORAL_TABLET | ORAL | Status: AC
  Filled 2014-09-14: qty 1

## 2014-09-14 MED ORDER — SODIUM CHLORIDE 0.9 % IV BOLUS (SEPSIS)
1000.0000 mL | Freq: Once | INTRAVENOUS | Status: AC
  Administered 2014-09-14: 1000 mL via INTRAVENOUS

## 2014-09-14 MED ORDER — TRAMADOL HCL 50 MG PO TABS
50.0000 mg | ORAL_TABLET | Freq: Once | ORAL | Status: AC
  Administered 2014-09-14: 50 mg via ORAL
  Filled 2014-09-14: qty 1

## 2014-09-14 MED ORDER — ACETAMINOPHEN 325 MG PO TABS
650.0000 mg | ORAL_TABLET | Freq: Once | ORAL | Status: AC
  Administered 2014-09-14: 650 mg via ORAL
  Filled 2014-09-14: qty 2

## 2014-09-14 NOTE — ED Provider Notes (Signed)
CSN: [id number]     Arrival date & time 09/14/14  1646 History   First MD Initiated Contact with Patient 09/14/14 1651     Chief Complaint  Patient presents with  . Dehydration     (Consider location/radiation/quality/duration/timing/severity/associated sxs/prior Treatment) HPI Comments: Patient presents to the emergency department for evaluation after being seen at urgent care earlier. Patient was seen at urgent care for nausea, vomiting and diarrhea. Patient reports that she was outside at a festival all day yesterday walking and being very active. She did not drink much during the day. There was concern about dehydration. Basic labs showed hyponatremia as well as urgent care, was referred to the emergency department. Patient reports that they'll aching pain in the mid abdomen associated with her symptoms. She feels weak all over and has a dull headache. No chest pain, shortness of breath. Patient has noticed that her fingers are painful and hands are swollen. She also has pain in the right knee, does have a history of arthritis.   Past Medical History  Diagnosis Date  . Allergy   . Arthritis   . Cancer   . Thyroid disease    Past Surgical History  Procedure Laterality Date  . Hand surgery      right hand   No family history on file. History  Substance Use Topics  . Smoking status: Never Smoker   . Smokeless tobacco: Never Used  . Alcohol Use: Yes   OB History    No data available     Review of Systems  Constitutional: Positive for fatigue.  Gastrointestinal: Positive for nausea, vomiting, abdominal pain and diarrhea.  Musculoskeletal: Positive for arthralgias.  Neurological: Positive for headaches.  All other systems reviewed and are negative.     Allergies  Review of patient's allergies indicates no known allergies.  Home Medications   Prior to Admission medications   Medication Sig Start Date End Date Taking? Authorizing Provider  atenolol (TENORMIN) 50 MG  tablet Take 50 mg by mouth daily.  06/01/13  Yes Historical Provider, MD  Calcium-Vitamin D 600-200 MG-UNIT per tablet Take 1 tablet by mouth 2 (two) times daily.   Yes Historical Provider, MD  citalopram (CELEXA) 20 MG tablet Take 20 mg by mouth daily.   Yes Historical Provider, MD  Cyanocobalamin (B-12 PO) Take 1 tablet by mouth daily.   Yes Historical Provider, MD  GARLIC PO Take 1 tablet by mouth daily.   Yes Historical Provider, MD  Misc Natural Products (CURCUMAX PRO) TABS Take 1 tablet by mouth daily.   Yes Historical Provider, MD  Omega-3 Fatty Acids (FISH OIL PO) Take 1 capsule by mouth daily.   Yes Historical Provider, MD  Pyridoxine HCl (B-6 PO) Take 1 tablet by mouth daily.   Yes Historical Provider, MD  SUMAtriptan (IMITREX) 25 MG tablet Take 25 mg by mouth every 2 (two) hours as needed for migraine or headache. May repeat in 2 hours if headache persists or recurs. /  06-10-13 patient doesn't know the mg/lc   Yes Historical Provider, MD  thyroid (ARMOUR THYROID) 60 MG tablet Take 30-60 mg by mouth See admin instructions. TAKES 30MG  ONLY ON MON'S  TAKES 60MG  ALL OTHER DAYS   Yes Historical Provider, MD  Vitamin E (VITA-PLUS E PO) Take 1 capsule by mouth daily.   Yes Historical Provider, MD   BP 120/54 mmHg  Pulse 61  Temp(Src) 98.2 F (36.8 C) (Oral)  Resp 20  SpO2 99% Physical Exam  Constitutional: She  is oriented to person, place, and time. She appears well-developed and well-nourished. No distress.  HENT:  Head: Normocephalic and atraumatic.  Right Ear: Hearing normal.  Left Ear: Hearing normal.  Nose: Nose normal.  Mouth/Throat: Oropharynx is clear and moist and mucous membranes are normal.  Eyes: Conjunctivae and EOM are normal. Pupils are equal, round, and reactive to light.  Neck: Normal range of motion. Neck supple.  Cardiovascular: Regular rhythm, S1 normal and S2 normal.  Bradycardia present.  Exam reveals no gallop and no friction rub.   No murmur  heard. Pulmonary/Chest: Effort normal and breath sounds normal. No respiratory distress. She exhibits no tenderness.  Abdominal: Soft. Normal appearance and bowel sounds are normal. There is no hepatosplenomegaly. There is tenderness in the periumbilical area. There is no rebound, no guarding, no tenderness at McBurney's point and negative Murphy's sign. No hernia.  Musculoskeletal: Normal range of motion.  Neurological: She is alert and oriented to person, place, and time. She has normal strength. No cranial nerve deficit or sensory deficit. Coordination normal. GCS eye subscore is 4. GCS verbal subscore is 5. GCS motor subscore is 6.  Skin: Skin is warm, dry and intact. No rash noted. No cyanosis.  Psychiatric: She has a normal mood and affect. Her speech is normal and behavior is normal. Thought content normal.  Nursing note and vitals reviewed.   ED Course  Procedures (including critical care time) Labs Review Labs Reviewed  CBC WITH DIFFERENTIAL/PLATELET - Abnormal; Notable for the following:    Hemoglobin 11.2 (*)    HCT 32.5 (*)    Platelets 124 (*)    Neutrophils Relative % 86 (*)    Lymphocytes Relative 9 (*)    Lymphs Abs 0.5 (*)    All other components within normal limits  COMPREHENSIVE METABOLIC PANEL - Abnormal; Notable for the following:    Sodium 121 (*)    Chloride 92 (*)    BUN <5 (*)    Calcium 7.7 (*)    Total Protein 6.2 (*)    Albumin 3.0 (*)    All other components within normal limits  I-STAT CHEM 8, ED - Abnormal; Notable for the following:    Sodium 128 (*)    Chloride 93 (*)    BUN 4 (*)    Glucose, Bld 172 (*)    All other components within normal limits  TROPONIN I  CK  URIC ACID  I-STAT CG4 LACTIC ACID, ED    Imaging Review No results found.   EKG Interpretation   Date/Time:  Sunday September 14 2014 17:03:51 EDT Ventricular Rate:  51 PR Interval:  199 QRS Duration: 99 QT Interval:  474 QTC Calculation: 437 R Axis:   58 Text  Interpretation:  Sinus rhythm LAE, consider biatrial enlargement Low  voltage, precordial leads No previous tracing Confirmed by POLLINA  MD,  CHRISTOPHER ([id number]) on 09/14/2014 5:12:24 PM      MDM   Final diagnoses:  None   dehydration  Hyponatremia  Resents to the ER from urgent care after she was found to be hyponatremic. Patient was out in the heat and doing a lot of walking all day yesterday. She reports that she did not drink any fluids while she was out. Today she has been experiencing aching pains in her joints in her hands and her right knee. There is no overlying erythema, warmth or joint effusions. No concern for joint infection. Patient's sodium was 122 at urgent care. This was confirmed here at  121. She has been given a fluid bolus, repeat is 128. She will be given additional fluid. She is feeling much better, is appropriate for discharge and follow-up with PCP.    Orpah Greek, MD 09/14/14 2022

## 2014-09-14 NOTE — ED Notes (Signed)
[facility] called to transport patient to main ED

## 2014-09-14 NOTE — ED Notes (Signed)
Pt verbalizes understanding of d/c instructions and denies any further need at this time. 

## 2014-09-14 NOTE — ED Notes (Signed)
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 Later , when she drove home , noted he didn't feel well, and her joints were achy. C/o she had a lot of trouble getting her Rx bottles open this Am , and had had vomiting and diarrhea. Family member expressed concern for poss gout, since her joints in hands are painful and swollen

## 2014-09-14 NOTE — ED Notes (Signed)
Pt sent from urgent care for dehydration and hyponatremia.

## 2014-09-14 NOTE — ED Provider Notes (Signed)
CSN: [id number]     Arrival date & time 09/14/14  1451 History   First MD Initiated Contact with Patient 09/14/14 1517     Chief Complaint  Patient presents with  . Nausea  . Joint Pain   (Consider location/radiation/quality/duration/timing/severity/associated sxs/prior Treatment) HPI  She is a 69 year old woman here for evaluation of joint pain and nausea. [place] and did not drink much water all day. She states she didn't urinate much yesterday either. Halfway through the day, she started having some pain and swelling in her left second MCP joint. She also reports feeling very fatigued by the end of the day. She reports chills overnight. Today, when she woke up she had pain in the MCP joints of both hands, worse in the left hand. It is bad enough that she could not open her pill bottles this morning. She called a family member who recommended she try some ibuprofen for possible gout. She took 2 Advil, after eating some bread, and an hour later had nausea and vomiting. She has also had some loose stool today. No blood in the stool or vomit. She has been trying to drink a lot of water today. She continues to feel nauseated. She reports one prior episode where she had swelling and redness of her left wrist that resolved spontaneously in 1 day.  She reports having a physical with her PCP on Thursday.  Past Medical History  Diagnosis Date  . Allergy   . Arthritis   . Cancer   . Thyroid disease    Past Surgical History  Procedure Laterality Date  . Hand surgery      right hand   History reviewed. No pertinent family history. History  Substance Use Topics  . Smoking status: Never Smoker   . Smokeless tobacco: Never Used  . Alcohol Use: Yes   OB History    No data available     Review of Systems As in history of present illness Allergies  Review of patient's allergies indicates no known allergies.  Home Medications   Prior to Admission  medications   Medication Sig Start Date End Date Taking? Authorizing Provider  atenolol (TENORMIN) 50 MG tablet  06/01/13   Historical Provider, MD  citalopram (CELEXA) 20 MG tablet Take 20 mg by mouth daily.    Historical Provider, MD  SUMAtriptan (IMITREX) 25 MG tablet Take 25 mg by mouth every 2 (two) hours as needed for migraine or headache. May repeat in 2 hours if headache persists or recurs. /  06-10-13 patient doesn't know the mg/lc    Historical Provider, MD  thyroid (ARMOUR THYROID) 60 MG tablet Take 60 mg by mouth daily before breakfast.    Historical Provider, MD  thyroid (ARMOUR) 30 MG tablet Take 30 mg by mouth daily before breakfast.    Historical Provider, MD   BP 133/67 mmHg  Pulse 51  Temp(Src) 98.1 F (36.7 C) (Oral)  Resp 16  SpO2 100% Physical Exam  Constitutional: She is oriented to person, place, and time. She appears well-developed and well-nourished. No distress.  Appears very fatigued.  Neck: Neck supple.  Cardiovascular: Normal rate, regular rhythm and normal heart sounds.   No murmur heard. Pulmonary/Chest: Effort normal and breath sounds normal. No respiratory distress. She has no wheezes. She has no rales.  Abdominal: Soft. Bowel sounds are normal. She exhibits no distension. There is no tenderness. There is no rebound and no guarding.  Musculoskeletal:  Hands: Bilateral  hands with swelling of the second through fifth MCP joints, worse on the left. Swelling extends into the proximal phalanxes. She has pain with both passive and active range of motion of the MCP joints, particularly on the left hand. The MCP joints are tender along the palmar aspect. She has 2+ radial pulses.  Neurological: She is alert and oriented to person, place, and time.    ED Course  Procedures (including critical care time) Labs Review Labs Reviewed  POCT I-STAT, CHEM 8 - Abnormal; Notable for the following:    Sodium 122 (*)    Chloride 91 (*)    Glucose, Bld 104 (*)    Calcium,  Ion 1.08 (*)    All other components within normal limits    Imaging Review No results found.   MDM   1. Hyponatremia   2. Arthralgia of hands, bilateral    Zofran 4 mg ODT given for nausea.  If no improvement in nausea after Zofran. I-STAT came back with hyponatremia as well as hypocalcemia. Given her level of hyponatremia as well as her clinical appearance, will transfer to [facility] ED for additional evaluation and management. Peripheral IV was started and normal saline at 50 mL per hour as she is likely dehydrated. I am concerned about acute rheumatism in her hands given the swelling and pain. She declined need for pain medication in the urgent care. I recommended that she follow-up with her primary care physician about this.   Melony Overly, MD 09/14/14 [phone]

## 2014-09-14 NOTE — ED Notes (Addendum)
Pt up and ambulating to the bathroom

## 2014-09-14 NOTE — Discharge Instructions (Signed)
Dehydration, Adult Dehydration is when you lose more fluids from the body than you take in. Vital organs like the kidneys, brain, and heart cannot function without a proper amount of fluids and salt. Any loss of fluids from the body can cause dehydration.  CAUSES   Vomiting.  Diarrhea.  Excessive sweating.  Excessive urine output.  Fever. SYMPTOMS  Mild dehydration  Thirst.  Dry lips.  Slightly dry mouth. Moderate dehydration  Very dry mouth.  Sunken eyes.  Skin does not bounce back quickly when lightly pinched and released.  Dark urine and decreased urine production.  Decreased tear production.  Headache. Severe dehydration  Very dry mouth.  Extreme thirst.  Rapid, weak pulse (more than 100 beats per minute at rest).  Cold hands and feet.  Not able to sweat in spite of heat and temperature.  Rapid breathing.  Blue lips.  Confusion and lethargy.  Difficulty being awakened.  Minimal urine production.  No tears. DIAGNOSIS  Your caregiver will diagnose dehydration based on your symptoms and your exam. Blood and urine tests will help confirm the diagnosis. The diagnostic evaluation should also identify the cause of dehydration. TREATMENT  Treatment of mild or moderate dehydration can often be done at home by increasing the amount of fluids that you drink. It is best to drink small amounts of fluid more often. Drinking too much at one time can make vomiting worse. Refer to the home care instructions below. Severe dehydration needs to be treated at the hospital where you will probably be given intravenous (IV) fluids that contain water and electrolytes. HOME CARE INSTRUCTIONS   Ask your caregiver about specific rehydration instructions.  Drink enough fluids to keep your urine clear or pale yellow.  Drink small amounts frequently if you have nausea and vomiting.  Eat as you normally do.  Avoid:  Foods or drinks high in sugar.  Carbonated  drinks.  Juice.  Extremely hot or cold fluids.  Drinks with caffeine.  Fatty, greasy foods.  Alcohol.  Tobacco.  Overeating.  Gelatin desserts.  Wash your hands well to avoid spreading bacteria and viruses.  Only take over-the-counter or prescription medicines for pain, discomfort, or fever as directed by your caregiver.  Ask your caregiver if you should continue all prescribed and over-the-counter medicines.  Keep all follow-up appointments with your caregiver. SEEK MEDICAL CARE IF:  You have abdominal pain and it increases or stays in one area (localizes).  You have a rash, stiff neck, or severe headache.  You are irritable, sleepy, or difficult to awaken.  You are weak, dizzy, or extremely thirsty. SEEK IMMEDIATE MEDICAL CARE IF:   You are unable to keep fluids down or you get worse despite treatment.  You have frequent episodes of vomiting or diarrhea.  You have blood or green matter (bile) in your vomit.  You have blood in your stool or your stool looks black and tarry.  You have not urinated in 6 to 8 hours, or you have only urinated a small amount of very dark urine.  You have a fever.  You faint. MAKE SURE YOU:   Understand these instructions.  Will watch your condition.  Will get help right away if you are not doing well or get worse. Document Released: 03/14/2005 Document Revised: 06/06/2011 Document Reviewed: 11/01/2010 [facility] Patient Information 2015 [place], [place]. This information is not intended to replace advice given to you by your health care provider. Make sure you discuss any questions you have with your health care  provider.  Hyponatremia  Hyponatremia is when the amount of salt (sodium) in your blood is too low. When sodium levels are low, your cells will absorb extra water and swell. The swelling happens throughout the body, but it mostly affects the brain. Severe brain swelling (cerebral edema), seizures, or coma can happen.   CAUSES   Heart, kidney, or liver problems.  Thyroid problems.  Adrenal gland problems.  Severe vomiting and diarrhea.  Certain medicines or illegal drugs.  Dehydration.  Drinking too much water.  Low-sodium diet. SYMPTOMS   Nausea and vomiting.  Confusion.  Lethargy.  Agitation.  Headache.  Twitching or shaking (seizures).  Unconsciousness.  Appetite loss.  Muscle weakness and cramping. DIAGNOSIS  Hyponatremia is identified by a simple blood test. Your caregiver will perform a history and physical exam to try to find the cause and type of hyponatremia. Other tests may be needed to measure the amount of sodium in your blood and urine. TREATMENT  Treatment will depend on the cause.   Fluids may be given through the vein (IV).  Medicines may be used to correct the sodium imbalance. If medicines are causing the problem, they will need to be adjusted.  Water or fluid intake may be restricted to restore proper balance. The speed of correcting the sodium problem is very important. If the problem is corrected too fast, nerve damage (sometimes unchangeable) can happen. HOME CARE INSTRUCTIONS   Only take medicines as directed by your caregiver. Many medicines can make hyponatremia worse. Discuss all your medicines with your caregiver.  Carefully follow any recommended diet, including any fluid restrictions.  You may be asked to repeat lab tests. Follow these directions.  Avoid alcohol and recreational drugs. SEEK MEDICAL CARE IF:   You develop worsening nausea, fatigue, headache, confusion, or weakness.  Your original hyponatremia symptoms return.  You have problems following the recommended diet. SEEK IMMEDIATE MEDICAL CARE IF:   You have a seizure.  You faint.  You have ongoing diarrhea or vomiting. MAKE SURE YOU:   Understand these instructions.  Will watch your condition.  Will get help right away if you are not doing well or get  worse. Document Released: 03/04/2002 Document Revised: 06/06/2011 Document Reviewed: 08/29/2010 [facility] Patient Information 2015 [place], [place]. This information is not intended to replace advice given to you by your health care provider. Make sure you discuss any questions you have with your health care provider.

## 2014-09-24 ENCOUNTER — Ambulatory Visit (INDEPENDENT_AMBULATORY_CARE_PROVIDER_SITE_OTHER): Payer: Medicare Other | Admitting: Podiatry

## 2014-09-24 ENCOUNTER — Encounter: Payer: Self-pay | Admitting: Podiatry

## 2014-09-24 DIAGNOSIS — Q828 Other specified congenital malformations of skin: Secondary | ICD-10-CM | POA: Diagnosis not present

## 2014-09-24 NOTE — Progress Notes (Signed)
Patient ID: Sheri Ray, female   DOB: [birth year], 69 y.o.   MRN: [id number] Subjective: Patient presents complaining of painful callouses and request debridement She also mentions a constant burning aggravated with weightbearing somewhat noticeable at night. She's had a recent physical examination and was told that all lab values within normal limits. She has a recent history of decreased sodium  Objective: DP and PT pulses 2/4 bilaterally Sensation to 10 g monofilament wire intact 5/5 bilaterally Refer sensation intact bilaterally Ankle reflexes equal reactive bilaterally Nucleated plantar keratoses right and left  Assessment: Symptoms may be consistentwith  neuropathy undetermined origin Multiple porokeratosis right and left  Plan: Advised patient to confirm with her primary care physician blood glucose within normal limits If burning persists over time would discuss these symptoms with her primary care physician Debridement of porokeratosis and packed with salinocaine  Reappoint at patient's request

## 2014-10-08 ENCOUNTER — Ambulatory Visit (AMBULATORY_SURGERY_CENTER): Payer: Self-pay | Admitting: *Deleted

## 2014-10-08 VITALS — Ht 63.0 in | Wt 147.0 lb

## 2014-10-08 DIAGNOSIS — Z1211 Encounter for screening for malignant neoplasm of colon: Secondary | ICD-10-CM

## 2014-10-08 MED ORDER — NA SULFATE-K SULFATE-MG SULF 17.5-3.13-1.6 GM/177ML PO SOLN
ORAL | Status: DC
Start: 2014-10-08 — End: 2015-01-20

## 2014-10-08 NOTE — Progress Notes (Signed)
Patient denies any allergies to eggs or soy. Patient denies any problems with anesthesia/sedation. Patient denies any oxygen use at home and does not take any diet/weight loss medications. Patient declined EMMI information at this time. Patient is to call her PCP to get last sodium result. Patient instructed to call us back if sodium level is abnormal.

## 2014-10-09 ENCOUNTER — Encounter: Payer: Self-pay | Admitting: Internal Medicine

## 2014-10-22 ENCOUNTER — Encounter: Payer: Self-pay | Admitting: Internal Medicine

## 2014-10-24 ENCOUNTER — Encounter: Payer: Self-pay | Admitting: Gastroenterology

## 2014-12-24 ENCOUNTER — Ambulatory Visit (INDEPENDENT_AMBULATORY_CARE_PROVIDER_SITE_OTHER): Payer: Medicare Other | Admitting: Podiatry

## 2014-12-24 ENCOUNTER — Encounter: Payer: Self-pay | Admitting: Podiatry

## 2014-12-24 DIAGNOSIS — Q828 Other specified congenital malformations of skin: Secondary | ICD-10-CM

## 2014-12-24 NOTE — Patient Instructions (Signed)
Leave acid bandages on if possible 3-5 days Return as needed

## 2014-12-25 ENCOUNTER — Encounter: Payer: Self-pay | Admitting: Podiatry

## 2014-12-25 NOTE — Progress Notes (Signed)
Patient ID: Sheri Ray, female   DOB: Nov 16, 1945, 69 y.o.   MRN: [id number]  Subjective: This patient presents today complaining of chronic painful plantar keratoses on the right and left feet. She presents at her request for repetitive debridement and application of salinocaine to these areas. The last visit for this service was 09/24/2014  Objective: Multiple punctate keratoses plantar aspect right and left feet  Assessment: Porokeratosis right and left feet  Plan: Debride porokeratosis right and left and apply salinocaine  Reappoint at patient's request

## 2015-01-05 ENCOUNTER — Encounter: Payer: Self-pay | Admitting: Gastroenterology

## 2015-01-19 ENCOUNTER — Encounter: Payer: Self-pay | Admitting: *Deleted

## 2015-01-19 ENCOUNTER — Telehealth: Payer: Self-pay | Admitting: Gastroenterology

## 2015-01-19 NOTE — Telephone Encounter (Signed)
Pt states she went to get her suprep and it was 95$ and she wants to just cancel if she has to pay this. I offered her a suprep sample, she asked why she cannot have miralax and gatorade like her friends have. I instructed her we can do this but she needs to come get new instructions.  Informed pt she needs to take dulcolax laxatives 5 mg today at 3 pm and then drink 32 ox miralax mixed in gatorade at 5 pm and 2 more dulcolax at 7 pm.   Then drink the opther 32 oz gatordae mira lax tomorrow am starting 9 am. Pt states she will get her new instructions before 12 pm today . Instructed to call with further questions.  Sheri Ray PV

## 2015-01-20 ENCOUNTER — Ambulatory Visit (AMBULATORY_SURGERY_CENTER): Payer: Medicare Other | Admitting: Gastroenterology

## 2015-01-20 ENCOUNTER — Encounter: Payer: Self-pay | Admitting: Gastroenterology

## 2015-01-20 VITALS — BP 122/83 | HR 69 | Temp 96.9°F | Resp 55 | Ht 63.0 in | Wt 157.0 lb

## 2015-01-20 DIAGNOSIS — D120 Benign neoplasm of cecum: Secondary | ICD-10-CM

## 2015-01-20 DIAGNOSIS — D128 Benign neoplasm of rectum: Secondary | ICD-10-CM

## 2015-01-20 DIAGNOSIS — D123 Benign neoplasm of transverse colon: Secondary | ICD-10-CM | POA: Diagnosis not present

## 2015-01-20 DIAGNOSIS — Z1211 Encounter for screening for malignant neoplasm of colon: Secondary | ICD-10-CM | POA: Diagnosis present

## 2015-01-20 HISTORY — PX: COLONOSCOPY: SHX174

## 2015-01-20 MED ORDER — SODIUM CHLORIDE 0.9 % IV SOLN: 500.0000 mL | INTRAVENOUS | Status: DC

## 2015-01-20 NOTE — Progress Notes (Signed)
Called to room to assist during endoscopic procedure.  Patient ID and intended procedure confirmed with present staff. Received instructions for my participation in the procedure from the performing physician.  

## 2015-01-20 NOTE — Op Note (Signed)
[place]  [address] [employer] [address] [place], [id number]   COLONOSCOPY PROCEDURE REPORT  PATIENT: Sheri Ray, Sheri Ray  MR#: [id number] BIRTHDATE: [birth year] , 59  yrs. old GENDER: female ENDOSCOPIST: Harl Bowie, MD REFERRED YK:[id number] Shaw, M.D. PROCEDURE DATE:  01/20/2015 PROCEDURE:   Colonoscopy with snare polypectomy and Colonoscopy with cold biopsy polypectomy First Screening Colonoscopy - Avg.  risk and is 50 yrs.  old or older - No.  Prior Negative Screening - Now for repeat screening. 10 or more years since last screening  History of Adenoma - Now for follow-up colonoscopy & has been > or = to 3 yrs.  N/A  Polyps removed today? Yes ASA CLASS:   Class II INDICATIONS:Screening for colonic neoplasia. MEDICATIONS: Propofol 325 IV  DESCRIPTION OF PROCEDURE:   After the risks benefits and alternatives of the procedure were thoroughly explained, informed consent was obtained.  The digital rectal exam revealed no abnormalities of the rectum.   The LB [id number]-H190 [id number]  endoscope was introduced through the anus and advanced to the cecum, which was identified by both the appendix and ileocecal valve. No adverse events experienced.   The quality of the prep was good.  The instrument was then slowly withdrawn as the colon was fully examined. Estimated blood loss is zero unless otherwise noted in this procedure report.   COLON FINDINGS: 2 mm polyp in cecum removed with cold biopsy forceps, 5 mm sessile polyp in cecum removed with cold snare, specimen retrieved. 2 sessile polyps in transverse colon ranging from 3-5 mm removed with cold snare, specimens retrieved. 3 mm polyp in rectum removed with cold biopsy forceps. Retroflexed views revealed internal Grade I hemorrhoids. The time to cecum = 16.7 Withdrawal time = 13.0   The scope was withdrawn and the procedure completed. COMPLICATIONS: There were no immediate complications.  ENDOSCOPIC IMPRESSION: 5 small colon  polyps removed.  RECOMMENDATIONS: 1.  Await pathology results 2.  If the polyp(s) removed today are proven to be adenomatous (pre-cancerous) polyps, you will need a colonoscopy in 3 years. Otherwise you should continue to follow colorectal cancer screening guidelines for "routine risk" patients with a colonoscopy in 10 years.  You will receive a letter within 1-2 weeks with the results of your biopsy as well as final recommendations.  Please call my office if you have not received a letter after 3 weeks.  eSigned:  Harl Bowie, MD 01/20/2015 3:00 PM [facility]

## 2015-01-20 NOTE — Patient Instructions (Signed)
YOU HAD AN ENDOSCOPIC PROCEDURE TODAY AT THE [facility]:   Refer to the procedure report that was given to you for any specific questions about what was found during the examination.  If the procedure report does not answer your questions, please call your gastroenterologist to clarify.  If you requested that your care partner not be given the details of your procedure findings, then the procedure report has been included in a sealed envelope for you to review at your convenience later.  YOU SHOULD EXPECT: Some feelings of bloating in the abdomen. Passage of more gas than usual.  Walking can help get rid of the air that was put into your GI tract during the procedure and reduce the bloating. If you had a lower endoscopy (such as a colonoscopy or flexible sigmoidoscopy) you may notice spotting of blood in your stool or on the toilet paper. If you underwent a bowel prep for your procedure, you may not have a normal bowel movement for a few days.  Please Note:  You might notice some irritation and congestion in your nose or some drainage.  This is from the oxygen used during your procedure.  There is no need for concern and it should clear up in a day or so.  SYMPTOMS TO REPORT IMMEDIATELY:   Following lower endoscopy (colonoscopy or flexible sigmoidoscopy):  Excessive amounts of blood in the stool  Significant tenderness or worsening of abdominal pains  Swelling of the abdomen that is new, acute  Fever of 100F or higher   For urgent or emergent issues, a gastroenterologist can be reached at any hour by calling [phone].   DIET: Your first meal following the procedure should be a small meal and then it is ok to progress to your normal diet. Heavy or fried foods are harder to digest and may make you feel nauseous or bloated.  Likewise, meals heavy in dairy and vegetables can increase bloating.  Drink plenty of fluids but you should avoid alcoholic beverages for 24  hours.  ACTIVITY:  You should plan to take it easy for the rest of today and you should NOT DRIVE or use heavy machinery until tomorrow (because of the sedation medicines used during the test).    FOLLOW UP: Our staff will call the number listed on your records the next business day following your procedure to check on you and address any questions or concerns that you may have regarding the information given to you following your procedure. If we do not reach you, we will leave a message.  However, if you are feeling well and you are not experiencing any problems, there is no need to return our call.  We will assume that you have returned to your regular daily activities without incident.  If any biopsies were taken you will be contacted by phone or by letter within the next 1-3 weeks.  Please call us at [phone] if you have not heard about the biopsies in 3 weeks.    SIGNATURES/CONFIDENTIALITY: You and/or your care partner have signed paperwork which will be entered into your electronic medical record.  These signatures attest to the fact that that the information above on your After Visit Summary has been reviewed and is understood.  Full responsibility of the confidentiality of this discharge information lies with you and/or your care-partner.  Polyp and hemorrhoid information given. 

## 2015-01-20 NOTE — Progress Notes (Signed)
Transferred to recovery room. A/O x3, pleased with MAC.  VSS.  Report to Jane, RN. 

## 2015-01-20 NOTE — Progress Notes (Signed)
Pt. States that she has abdominal cramping.  She is not distended.  She has passed gas throughout her stay in recovery area.  States she has a headache And feels like she is getting the flu.

## 2015-01-21 ENCOUNTER — Telehealth: Payer: Self-pay | Admitting: *Deleted

## 2015-01-21 NOTE — Telephone Encounter (Signed)
  Follow up Call-  Call back number 01/20/2015  Post procedure Call Back phone  # [phone]  Permission to leave phone message Yes     No answer and answering machine did not pick up to leave message.

## 2015-01-26 ENCOUNTER — Encounter: Payer: Self-pay | Admitting: Gastroenterology

## 2015-01-29 ENCOUNTER — Encounter: Payer: Self-pay | Admitting: Gastroenterology

## 2015-04-21 ENCOUNTER — Telehealth: Payer: Self-pay | Admitting: Podiatry

## 2015-04-21 NOTE — Telephone Encounter (Signed)
Left voicemail for pt to call to schedule appt.

## 2015-04-29 ENCOUNTER — Other Ambulatory Visit: Payer: Self-pay | Admitting: Family Medicine

## 2015-04-29 DIAGNOSIS — R1011 Right upper quadrant pain: Secondary | ICD-10-CM

## 2015-05-04 ENCOUNTER — Ambulatory Visit (INDEPENDENT_AMBULATORY_CARE_PROVIDER_SITE_OTHER): Payer: Medicare Other | Admitting: Sports Medicine

## 2015-05-04 ENCOUNTER — Encounter: Payer: Self-pay | Admitting: Sports Medicine

## 2015-05-04 VITALS — BP 137/78 | HR 53 | Resp 14

## 2015-05-04 DIAGNOSIS — Q828 Other specified congenital malformations of skin: Secondary | ICD-10-CM | POA: Diagnosis not present

## 2015-05-04 DIAGNOSIS — M79672 Pain in left foot: Secondary | ICD-10-CM | POA: Diagnosis not present

## 2015-05-04 DIAGNOSIS — M79671 Pain in right foot: Secondary | ICD-10-CM | POA: Diagnosis not present

## 2015-05-04 NOTE — Progress Notes (Signed)
Patient ID: Sheri Ray, female   DOB: [birth year], 70 y.o.   MRN: [id number] Subjective: Sheri Ray is a 70 y.o. female patient who presents to office for evaluation of bilateral foot pain. Patient complains of pain at the callus lesions present at the ball of both feet; reports that coming in for routine trimming of the small lesions help for usually 4-5 months. Patient denies any other pedal complaints.   Patient Active Problem List   Diagnosis Date Noted  . Foot pain 12/07/2010  . Metatarsalgia 12/07/2010  . ANSERINE BURSITIS, LEFT 08/25/2009  . DEGENERATIVE JOINT DISEASE, KNEE 08/05/2009  . DIZZINESS AND GIDDINESS 12/24/2008  . NONSPECIFIC ABNORMAL ELECTROCARDIOGRAM 12/24/2008  . HYPOTHYROIDISM 12/17/2008  . HYPERLIPIDEMIA 12/17/2008  . DEPRESSION 12/17/2008  . MIGRAINE HEADACHE 12/17/2008  . SINUS BRADYCARDIA 12/17/2008  . PALPITATIONS 12/17/2008  . CHEST PAIN, ATYPICAL 12/17/2008  . ALLERGY 12/17/2008   Current Outpatient Prescriptions on File Prior to Visit  Medication Sig Dispense Refill  . atenolol (TENORMIN) 50 MG tablet Take 50 mg by mouth daily.     . Calcium-Vitamin D 600-200 MG-UNIT per tablet Take 1 tablet by mouth 2 (two) times daily.    . citalopram (CELEXA) 20 MG tablet Take 20 mg by mouth daily.    . Cyanocobalamin (B-12 PO) Take 1 tablet by mouth daily.    Marland Kitchen GARLIC PO Take 1 tablet by mouth daily.    . Misc Natural Products (CURCUMAX PRO) TABS Take 1 tablet by mouth daily.    . Omega-3 Fatty Acids (FISH OIL PO) Take 1 capsule by mouth daily.    . Pyridoxine HCl (B-6 PO) Take 1 tablet by mouth daily.    . SUMAtriptan (IMITREX) 25 MG tablet Take 25 mg by mouth every 2 (two) hours as needed for migraine or headache. May repeat in 2 hours if headache persists or recurs. /  06-10-13 patient doesn't know the mg/lc    . thyroid (ARMOUR THYROID) 60 MG tablet Take 30-60 mg by mouth See admin instructions. TAKES 30MG  ONLY ON MON'S  TAKES 60MG  ALL OTHER DAYS    .  Vitamin E (VITA-PLUS E PO) Take 1 capsule by mouth daily.     No current facility-administered medications on file prior to visit.   Allergies  Allergen Reactions  . Latex Itching    Burning, itching, pain     Objective:  General: Alert and oriented x3 in no acute distress  Dermatology: Keratotic lesions present bilateral plantar forefoot x 6 in total with central nucleated core noted consistent with porokeratosis, no webspace macerations, no ecchymosis bilateral, all nails x 10 are well manicured.  Vascular: Dorsalis Pedis 2/4 and Posterior Tibial pedal pulses 1/4, Capillary Fill Time 3 seconds, + pedal hair growth bilateral, no edema bilateral lower extremities, Temperature gradient within normal limits.  Neurology: Johney Maine sensation intact via light touch bilateral.  Musculoskeletal: Mild tenderness with palpation at the keratotic lesion sites bilateral plantar forefoot, Muscular strength 5/5 in all groups without pain on range of motion. No lower extremity muscular or boney deformity noted.   Assessment and Plan: Problem List Items Addressed This Visit    None    Visit Diagnoses    Foot pain, bilateral    -  Primary    Porokeratosis          -Complete examination performed -Discussed treatment options for porokeratosis  -Parred keratoic lesions using a chisel blade; treated the area with Salinocaine covered with Moleskin; Advised patient to keep intact for 1  day -Recommend daily skin emollients -Recommend good supportive shoes and inserts daily -Patient to return to office as needed or sooner if condition worsens.  Landis Martins, DPM

## 2015-05-11 ENCOUNTER — Ambulatory Visit
Admission: RE | Admit: 2015-05-11 | Discharge: 2015-05-11 | Disposition: A | Discharge status: Home / Self Care | Payer: Medicare Other | Source: Ambulatory Visit | Attending: Family Medicine | Admitting: Family Medicine

## 2015-05-11 DIAGNOSIS — R1011 Right upper quadrant pain: Secondary | ICD-10-CM

## 2015-11-11 ENCOUNTER — Other Ambulatory Visit: Payer: Self-pay | Admitting: Family Medicine

## 2015-11-11 DIAGNOSIS — N939 Abnormal uterine and vaginal bleeding, unspecified: Secondary | ICD-10-CM

## 2015-11-16 ENCOUNTER — Ambulatory Visit
Admission: RE | Admit: 2015-11-16 | Discharge: 2015-11-16 | Disposition: A | Discharge status: Home / Self Care | Payer: Medicare Other | Source: Ambulatory Visit | Attending: Family Medicine | Admitting: Family Medicine

## 2015-11-16 DIAGNOSIS — N939 Abnormal uterine and vaginal bleeding, unspecified: Secondary | ICD-10-CM

## 2015-12-08 ENCOUNTER — Other Ambulatory Visit: Payer: Self-pay | Admitting: Obstetrics and Gynecology

## 2016-01-05 ENCOUNTER — Encounter: Payer: Self-pay | Admitting: Podiatry

## 2016-01-05 ENCOUNTER — Ambulatory Visit (INDEPENDENT_AMBULATORY_CARE_PROVIDER_SITE_OTHER): Payer: Medicare Other | Admitting: Podiatry

## 2016-01-05 VITALS — BP 161/92 | HR 53 | Resp 14

## 2016-01-05 DIAGNOSIS — Q828 Other specified congenital malformations of skin: Secondary | ICD-10-CM | POA: Diagnosis not present

## 2016-01-05 DIAGNOSIS — M7750 Other enthesopathy of unspecified foot: Secondary | ICD-10-CM | POA: Diagnosis not present

## 2016-01-05 NOTE — Patient Instructions (Signed)
Sheri Ray injected a local cortisone medicine mixed with a local anesthetic in or around the base of the fifth metatarsal area on the left foot. Avoid activities that tend to flare this area for the next 1-2 weeks Return as needed

## 2016-01-05 NOTE — Progress Notes (Signed)
Patient ID: Sheri Ray, female   DOB: [birth year], 70 y.o.   MRN: [id number]   Subjective: Patient presents for ongoing debridement of multiple nucleated keratoses left greater than right. Also complaining of a six-month history of discomfort in the base of fifth metatarsal area aggravated with fast walking and certain yoga positions. Patient takes ibuprofen tablets to estimated every other day to release symptoms. Her medical doctor suggested that a cortisone injection in this area may be of some benefit  Objective: Orientated 3 DP and PT pulses 2/4 bilaterally Capillary reflex immediate bilaterally Palpable tenderness base of fifth metatarsal which duplicates area of discomfort Dorsi flexion, plantar flexion, inversion, eversion 5/5 bilaterally Multiple nucleated plantar keratoses plantar left greater than right  Assessment: Insertional peroneal tendinitis base of fifth metatarsal left Porokeratosis multiple lesions right and left  Plan: I offered patient local corticosteroid injection into the area here wear that the symptoms generally improve with these injections, however could recur. Patient verbally consents  Skin is prepped with alcohol and Betadine and 10 mg of Kenalog mixed with 10 mg of plain Xylocaine and 2.5 mg of plain Sensorcaine were injected around the base of fifth metatarsal area. Patient tolerated procedure without any difficulty.  Multiple porokeratosis debrided right and left with salinocaine apply to lesions on left foot  Reappoint at patient's request

## 2016-03-15 ENCOUNTER — Encounter: Payer: Self-pay | Admitting: Podiatry

## 2016-03-15 ENCOUNTER — Ambulatory Visit (INDEPENDENT_AMBULATORY_CARE_PROVIDER_SITE_OTHER): Payer: Self-pay | Admitting: Podiatry

## 2016-03-15 VITALS — BP 159/92 | HR 73 | Resp 14

## 2016-03-15 DIAGNOSIS — L309 Dermatitis, unspecified: Secondary | ICD-10-CM

## 2016-03-15 NOTE — Progress Notes (Signed)
   Subjective:    Patient ID: Sheri Ray, female    DOB: [birth year], 70 y.o.   MRN: [id number]  HPI    Review of Systems  All other systems reviewed and are negative.      Objective:   Physical Exam        Assessment & Plan:

## 2016-03-15 NOTE — Progress Notes (Signed)
Patient ID: Amy Garrison, female   DOB: [birth year], 70 y.o.   MRN: [id number]  Subjective: This patient presents today complaining of a rash and tenderness over the injection site on the base of the fifth left metatarsal area.on the visit of 01/05/2016. Approximate 2 weeks after the injection was given patient developed a rash in the injection site describing a burning tenderness in the area. Patient has been soaking foot in alcohol and applying topical antibiotic ointment. The rash has not improved in appearance, however, has not progressed in size  Objective: Orientated 3 No calf edema or calf tenderness bilaterally DP and PT pulses 2/4 bilaterally Capillary reflex immediate bilaterally Dorsal lateral base fifth metatarsal has scaling inflamed skin without warmth, drainage The skin is tender to direct palpation in the area with mild palpable tenderness in the base of fifth metatarsal  Assessment: Dermatitis dorsal lateral aspect left foot  Plan: DC topical antibiotic ointment Patient instructed to apply over-the-counter 1% hydrocortisone cream 3-4 times a day until rash resolves. If patient notices that the area is not improving she will return for further evaluation  Reappoint at patient's request

## 2016-03-15 NOTE — Patient Instructions (Signed)
There is some inflamed skin over the base of the fifth metatarsal on the left foot that has not responded to topical antibiotic ointments and alcohol rinses Do not use any alcohol. There is no lead to soak the foot Apply over-the-counter 1% hydrocortisone cream 3-4 times daily to the area and protect with Band-Aid If the area does not improve or show signs of improvement in the next week return for further evaluation

## 2016-06-16 ENCOUNTER — Ambulatory Visit (INDEPENDENT_AMBULATORY_CARE_PROVIDER_SITE_OTHER): Payer: 59 | Admitting: Podiatrist

## 2016-06-16 ENCOUNTER — Encounter: Payer: Self-pay | Admitting: Podiatrist

## 2016-06-16 DIAGNOSIS — M216X1 Other acquired deformities of right foot: Secondary | ICD-10-CM

## 2016-06-16 DIAGNOSIS — Q828 Other specified congenital malformations of skin: Secondary | ICD-10-CM

## 2016-06-16 DIAGNOSIS — M216X2 Other acquired deformities of left foot: Secondary | ICD-10-CM

## 2016-06-16 NOTE — Patient Instructions (Signed)
Corns and Calluses Corns are small areas of thickened skin that occur on the top, sides, or tip of a toe. They contain a cone-shaped core with a point that can press on a nerve below. This causes pain. Calluses are areas of thickened skin that can occur anywhere on the body including hands, fingers, palms, soles of the feet, and heels.Calluses are usually larger than corns. What are the causes? Corns and calluses are caused by rubbing (friction) or pressure, such as from shoes that are too tight or do not fit properly. What increases the risk? Corns are more likely to develop in people who have toe deformities, such as hammer toes. Since calluses can occur with friction to any area of the skin, calluses are more likely to develop in people who:  Work with their hands.  Wear shoes that fit poorly, shoes that are too tight, or shoes that are high-heeled.  Have toes deformities. What are the signs or symptoms? Symptoms of a corn or callus include:  A hard growth on the skin.  Pain or tenderness under the skin.  Redness and swelling.  Increased discomfort while wearing tight-fitting shoes. How is this diagnosed? Corns and calluses may be diagnosed with a medical history and physical exam. How is this treated? Corns and calluses may be treated with:  Removing the cause of the friction or pressure. This may include:  Changing your shoes.  Wearing shoe inserts (orthotics) or other protective layers in your shoes, such as a corn pad.  Wearing gloves.  Medicines to help soften skin in the hardened, thickened areas.  Reducing the size of the corn or callus by removing the dead layers of skin.  Antibiotic medicines to treat infection.  Surgery, if a toe deformity is the cause. Follow these instructions at home:  Take medicines only as directed by your health care provider.  If you were prescribed an antibiotic, finish all of it even if you start to feel better.  Wear shoes that  fit well. Avoid wearing high-heeled shoes and shoes that are too tight or too loose.  Wear any padding, protective layers, gloves, or orthotics as directed by your health care provider.  Soak your hands or feet and then use a file or pumice stone to soften your corn or callus. Do this as directed by your health care provider.  Check your corn or callus every day for signs of infection. Watch for:  Redness, swelling, or pain.  Fluid, blood, or pus. Contact a health care provider if:  Your symptoms do not improve with treatment.  You have increased redness, swelling, or pain at the site of your corn or callus.  You have fluid, blood, or pus coming from your corn or callus.  You have new symptoms. This information is not intended to replace advice given to you by your health care provider. Make sure you discuss any questions you have with your health care provider. Document Released: 12/19/2003 Document Revised: 10/02/2015 Document Reviewed: 03/10/2014 Elsevier Interactive Patient Education  2017 [employer]  

## 2016-06-16 NOTE — Progress Notes (Signed)
Subjective: This patient presents today complaining of chronic painful plantar keratoses on the right and left feet. She presents at her request for repetitive debridement and application of salinocaine to these areas. The last visit for this service was 12/24/14.  She relates the injection given at the fifth metatarsal base was great and she has had significant relief.  She has no trouble there today.   Objective: neurovascular status intact.  Prominent plantarflexed metatarsals submet 2 present.  Multiple punctate keratoses plantar aspect right and left feet  Assessment: Porokeratosis right and left feet  Plan: Debride porokeratosis right and left and apply salinocaine

## 2017-05-25 DIAGNOSIS — R411 Anterograde amnesia: Secondary | ICD-10-CM | POA: Diagnosis not present

## 2017-05-25 DIAGNOSIS — R69 Illness, unspecified: Secondary | ICD-10-CM | POA: Diagnosis not present

## 2017-06-06 ENCOUNTER — Ambulatory Visit: Payer: Medicare HMO | Admitting: Podiatry

## 2017-06-06 DIAGNOSIS — D492 Neoplasm of unspecified behavior of bone, soft tissue, and skin: Secondary | ICD-10-CM | POA: Diagnosis not present

## 2017-06-06 DIAGNOSIS — D2371 Other benign neoplasm of skin of right lower limb, including hip: Secondary | ICD-10-CM

## 2017-06-06 DIAGNOSIS — Q828 Other specified congenital malformations of skin: Secondary | ICD-10-CM | POA: Diagnosis not present

## 2017-06-06 DIAGNOSIS — M79673 Pain in unspecified foot: Secondary | ICD-10-CM | POA: Diagnosis not present

## 2017-06-07 DIAGNOSIS — E039 Hypothyroidism, unspecified: Secondary | ICD-10-CM | POA: Diagnosis not present

## 2017-06-07 DIAGNOSIS — R41840 Attention and concentration deficit: Secondary | ICD-10-CM | POA: Diagnosis not present

## 2017-06-07 DIAGNOSIS — E782 Mixed hyperlipidemia: Secondary | ICD-10-CM | POA: Diagnosis not present

## 2017-06-07 DIAGNOSIS — R69 Illness, unspecified: Secondary | ICD-10-CM | POA: Diagnosis not present

## 2017-06-11 DIAGNOSIS — J209 Acute bronchitis, unspecified: Secondary | ICD-10-CM | POA: Diagnosis not present

## 2017-06-11 NOTE — Progress Notes (Signed)
Subjective: 72 year old female presents the office today for concerns of recurrent painful calluses to both of her feet.  She denies any recent injury she denies any swelling or redness or drainage.  She has no other concerns today. Denies any systemic complaints such as fevers, chills, nausea, vomiting. No acute changes since last appointment, and no other complaints at this time.   Objective: AAO x3, NAD DP/PT pulses palpable bilaterally, CRT less than 3 seconds Multiple punctate annular hyperkeratotic lesions present bilateral submetatarsal 2.  Upon debridement there is no underlying ulceration, drainage or any clinical signs of infection present. No open lesions or pre-ulcerative lesions.  No pain with calf compression, swelling, warmth, erythema  Assessment: Porokeratosis bilaterally  Plan: -All treatment options discussed with the patient including all alternatives, risks, complications.  -Lesions are sharp debrided to both feet without any complications or bleeding.  Area was cleaned with alcohol and a pad was placed followed by salinocaine and a bandage.  Post procedure injections were discussed.  Months for infection. -Follow-up as needed. -Patient encouraged to call the office with any questions, concerns, change in symptoms.   Trula Slade DPM

## 2017-06-13 ENCOUNTER — Encounter (HOSPITAL_COMMUNITY): Payer: Self-pay | Admitting: Emergency Medicine

## 2017-06-13 ENCOUNTER — Emergency Department (HOSPITAL_COMMUNITY): Payer: Medicare HMO

## 2017-06-13 ENCOUNTER — Emergency Department (HOSPITAL_COMMUNITY)
Admission: EM | Admit: 2017-06-13 | Discharge: 2017-06-13 | Disposition: A | Discharge status: Home / Self Care | Payer: Medicare HMO | Attending: Emergency Medicine | Admitting: Emergency Medicine

## 2017-06-13 DIAGNOSIS — B9789 Other viral agents as the cause of diseases classified elsewhere: Secondary | ICD-10-CM

## 2017-06-13 DIAGNOSIS — J069 Acute upper respiratory infection, unspecified: Secondary | ICD-10-CM | POA: Diagnosis not present

## 2017-06-13 DIAGNOSIS — Z79899 Other long term (current) drug therapy: Secondary | ICD-10-CM | POA: Insufficient documentation

## 2017-06-13 DIAGNOSIS — E039 Hypothyroidism, unspecified: Secondary | ICD-10-CM | POA: Diagnosis not present

## 2017-06-13 DIAGNOSIS — R05 Cough: Secondary | ICD-10-CM | POA: Diagnosis not present

## 2017-06-13 DIAGNOSIS — R0981 Nasal congestion: Secondary | ICD-10-CM | POA: Diagnosis not present

## 2017-06-13 MED ORDER — ACETAMINOPHEN 325 MG PO TABS
650.0000 mg | ORAL_TABLET | Freq: Once | ORAL | Status: DC
  Filled 2017-06-13: qty 2

## 2017-06-13 MED ORDER — ALBUTEROL SULFATE HFA 108 (90 BASE) MCG/ACT IN AERS
2.0000 | INHALATION_SPRAY | RESPIRATORY_TRACT | Status: DC
  Administered 2017-06-13: 2 via RESPIRATORY_TRACT
  Filled 2017-06-13: qty 6.7

## 2017-06-13 NOTE — ED Provider Notes (Addendum)
[facility] DEPT Provider Note   CSN: [id number] Arrival date & time: 06/13/17  0531     History   Chief Complaint Chief Complaint  Patient presents with  . Cough    HPI Sheri Ray is a 72 y.o. female.  HPI 72 yo female presents with persistent cough for several days, worse at night. Nasal congestion and hoarse voice for several days. Hx of seasonal allergies. Blowing her nose more frequently. No exertional SOB or SOB at rest. No sick contacts. No hx of COPD or tobacco abuse. No unilateral leg swelling or hx of CHF. No orthopnea   Past Medical History:  Diagnosis Date  . Allergy   . Arthritis   . Cancer ([place])   . Thyroid disease     Patient Active Problem List   Diagnosis Date Noted  . Foot pain 12/07/2010  . Metatarsalgia 12/07/2010  . ANSERINE BURSITIS, LEFT 08/25/2009  . DEGENERATIVE JOINT DISEASE, KNEE 08/05/2009  . DIZZINESS AND GIDDINESS 12/24/2008  . NONSPECIFIC ABNORMAL ELECTROCARDIOGRAM 12/24/2008  . HYPOTHYROIDISM 12/17/2008  . HYPERLIPIDEMIA 12/17/2008  . DEPRESSION 12/17/2008  . MIGRAINE HEADACHE 12/17/2008  . SINUS BRADYCARDIA 12/17/2008  . PALPITATIONS 12/17/2008  . CHEST PAIN, ATYPICAL 12/17/2008  . ALLERGY 12/17/2008    Past Surgical History:  Procedure Laterality Date  . ANKLE FRACTURE SURGERY    . FINGER SURGERY    . HAND SURGERY     right hand  . TONSILLECTOMY      OB History    No data available       Home Medications    Prior to Admission medications   Medication Sig Start Date End Date Taking? Authorizing Provider  atenolol (TENORMIN) 50 MG tablet Take 50 mg by mouth daily.  06/01/13   [provider]  Calcium-Vitamin D 600-200 MG-UNIT per tablet Take 1 tablet by mouth 2 (two) times daily.    [provider]  citalopram (CELEXA) 20 MG tablet Take 20 mg by mouth daily.    [provider]  Cyanocobalamin (B-12 PO) Take 1 tablet by mouth daily.    [provider]  GARLIC PO Take 1 tablet by mouth daily.    [provider]  Misc Natural Products (CURCUMAX PRO) TABS Take 1 tablet by mouth daily.    [provider]  Omega-3 Fatty Acids (FISH OIL PO) Take 1 capsule by mouth daily.    [provider]  Pyridoxine HCl (B-6 PO) Take 1 tablet by mouth daily.    [provider]  SUMAtriptan (IMITREX) 25 MG tablet Take 25 mg by mouth every 2 (two) hours as needed for migraine or headache. May repeat in 2 hours if headache persists or recurs. /  06-10-13 patient doesn't know the mg/lc    [provider]  thyroid (ARMOUR THYROID) 60 MG tablet Take 30-60 mg by mouth See admin instructions. TAKES 30MG  ONLY ON MON'S  TAKES 60MG  ALL OTHER DAYS    [provider]  Vitamin E (VITA-PLUS E PO) Take 1 capsule by mouth daily.    [provider]    Family History Family History  Problem Relation Age of Onset  . Colon cancer Neg Hx     Social History Social History   Tobacco Use  . Smoking status: Never Smoker  . Smokeless tobacco: Never Used  Substance Use Topics  . Alcohol use: Yes    Alcohol/week: 0.0 oz  . Drug use: No     Allergies  Latex   Review of Systems Review of Systems  All other systems reviewed and are negative.    Physical Exam Updated Vital Signs BP (!) 146/76 (BP Location: Right Arm)   Pulse 77   Temp 98.2 F (36.8 C) (Oral)   Resp 18   SpO2 99%   Physical Exam  Constitutional: She is oriented to person, place, and time. She appears well-developed and well-nourished. No distress.  HENT:  Head: Normocephalic and atraumatic.  Posterior pharynx normal  Eyes: EOM are normal.  Neck: Normal range of motion.  Cardiovascular: Normal rate, regular rhythm and normal heart sounds.  Pulmonary/Chest: Effort normal and breath sounds normal.  Abdominal: Soft. She exhibits no distension. There is no tenderness.  Musculoskeletal: Normal range of motion.    Neurological: She is alert and oriented to person, place, and time.  Skin: Skin is warm and dry.  Psychiatric: She has a normal mood and affect. Judgment normal.  Nursing note and vitals reviewed.    ED Treatments / Results  Labs (all labs ordered are listed, but only abnormal results are displayed) Labs Reviewed - No data to display  EKG  EKG Interpretation None       Radiology Dg Chest 2 View  Result Date: 06/13/2017 CLINICAL DATA:  Cough. EXAM: CHEST - 2 VIEW COMPARISON:  None. FINDINGS: The cardiomediastinal contours are normal. Mild aortic arch atherosclerosis. Subsegmental atelectasis or scarring at the left lung base. Pulmonary vasculature is normal. No consolidation, pleural effusion, or pneumothorax. No acute osseous abnormalities are seen. IMPRESSION: 1. Subsegmental atelectasis or scarring at the left lung base. 2. Mild aortic atherosclerosis. Electronically Signed   By: Jeb Levering M.D.   On: 06/13/2017 06:16    Procedures Procedures (including critical care time)  Medications Ordered in ED Medications  albuterol (PROVENTIL HFA;VENTOLIN HFA) 108 (90 Base) MCG/ACT inhaler 2 puff (not administered)     Initial Impression / Assessment and Plan / ED Course  I have reviewed the triage vital signs and the nursing notes.  Pertinent labs & imaging results that were available during my care of the patient were reviewed by me and considered in my medical decision making (see chart for details).     Suspect viral URI with cough. No hypoxia. Well appearing. No fevers. Bronchodilators and close pcp follow up. Instructed to return to the ER for new or worsening symptoms  cxr personally reviewed. No edema. No infiltrate.   Final Clinical Impressions(s) / ED Diagnoses   Final diagnoses:  Viral URI with cough    ED Discharge Orders    None       Jola Schmidt, MD 06/13/17 [id number]    Jola Schmidt, MD 06/13/17 [phone]

## 2017-06-13 NOTE — ED Triage Notes (Signed)
Patient here from home with complaints of cough since Sunday. Productive cough with clear sputum. " I cant stop coughing".

## 2017-06-13 NOTE — ED Notes (Signed)
PT DECLINED TEA OR COFFEE

## 2017-06-15 DIAGNOSIS — J209 Acute bronchitis, unspecified: Secondary | ICD-10-CM | POA: Diagnosis not present

## 2017-06-29 DIAGNOSIS — R69 Illness, unspecified: Secondary | ICD-10-CM | POA: Diagnosis not present

## 2017-06-29 DIAGNOSIS — F411 Generalized anxiety disorder: Secondary | ICD-10-CM | POA: Diagnosis not present

## 2017-07-12 ENCOUNTER — Ambulatory Visit: Payer: Medicare HMO | Admitting: Podiatry

## 2017-07-12 DIAGNOSIS — M7672 Peroneal tendinitis, left leg: Secondary | ICD-10-CM | POA: Diagnosis not present

## 2017-07-13 DIAGNOSIS — F411 Generalized anxiety disorder: Secondary | ICD-10-CM | POA: Diagnosis not present

## 2017-07-13 DIAGNOSIS — R69 Illness, unspecified: Secondary | ICD-10-CM | POA: Diagnosis not present

## 2017-07-18 NOTE — Progress Notes (Signed)
   HPI: 72 year old female presenting today with a chief complaint of pain to the lateral left foot. She states she has been seen by Dr. Amalia Hailey in the past for the same complaint and received injections which have helped alleviate the pain. Walking and bearing weight increases the pain. Patient is here for further evaluation and treatment.   Past Medical History:  Diagnosis Date  . Allergy   . Arthritis   . Cancer ([place])   . Thyroid disease      Physical Exam: General: The patient is alert and oriented x3 in no acute distress.  Dermatology: Skin is warm, dry and supple bilateral lower extremities. Negative for open lesions or macerations.  Vascular: Palpable pedal pulses bilaterally. No edema or erythema noted. Capillary refill within normal limits.  Neurological: Epicritic and protective threshold grossly intact bilaterally.   Musculoskeletal Exam: Pain with palpation to the insertion of the peroneal tendon of the left foot. Range of motion within normal limits to all pedal and ankle joints bilateral. Muscle strength 5/5 in all groups bilateral.   Assessment: 1. Insertional peroneal tendinitis left   Plan of Care:  1. Patient evaluated.   2. Injection of 0.5 mLs Celestone Soluspan injected into the insertion of the peroneal tendon sheath of the left foot.  3. Recommended good shoe gear.  4. Return to clinic as needed.       Edrick Kins, DPM [facility]  Dr. Edrick Kins, DPM    [address], [place] [address]                Office [phone]  Fax [phone]

## 2017-07-20 DIAGNOSIS — R69 Illness, unspecified: Secondary | ICD-10-CM | POA: Diagnosis not present

## 2017-07-20 DIAGNOSIS — F411 Generalized anxiety disorder: Secondary | ICD-10-CM | POA: Diagnosis not present

## 2017-07-24 DIAGNOSIS — M7989 Other specified soft tissue disorders: Secondary | ICD-10-CM | POA: Diagnosis not present

## 2017-08-08 DIAGNOSIS — M2550 Pain in unspecified joint: Secondary | ICD-10-CM | POA: Diagnosis not present

## 2017-09-06 ENCOUNTER — Telehealth: Payer: Self-pay | Admitting: Family Medicine

## 2017-09-06 ENCOUNTER — Ambulatory Visit
Admission: RE | Admit: 2017-09-06 | Discharge: 2017-09-06 | Disposition: A | Discharge status: Home / Self Care | Payer: Medicare HMO | Source: Ambulatory Visit | Attending: Family Medicine | Admitting: Family Medicine

## 2017-09-06 ENCOUNTER — Encounter: Payer: Self-pay | Admitting: Family Medicine

## 2017-09-06 ENCOUNTER — Ambulatory Visit: Payer: Medicare HMO | Admitting: Family Medicine

## 2017-09-06 VITALS — BP 140/88 | Ht 63.0 in | Wt 145.0 lb

## 2017-09-06 DIAGNOSIS — M79662 Pain in left lower leg: Secondary | ICD-10-CM | POA: Diagnosis not present

## 2017-09-06 DIAGNOSIS — M25572 Pain in left ankle and joints of left foot: Secondary | ICD-10-CM

## 2017-09-06 DIAGNOSIS — M7989 Other specified soft tissue disorders: Secondary | ICD-10-CM | POA: Diagnosis not present

## 2017-09-06 DIAGNOSIS — J209 Acute bronchitis, unspecified: Secondary | ICD-10-CM | POA: Diagnosis not present

## 2017-09-06 DIAGNOSIS — S9032XA Contusion of left foot, initial encounter: Secondary | ICD-10-CM | POA: Diagnosis not present

## 2017-09-06 DIAGNOSIS — S96912A Strain of unspecified muscle and tendon at ankle and foot level, left foot, initial encounter: Secondary | ICD-10-CM | POA: Diagnosis not present

## 2017-09-06 DIAGNOSIS — R69 Illness, unspecified: Secondary | ICD-10-CM | POA: Diagnosis not present

## 2017-09-06 DIAGNOSIS — S8992XA Unspecified injury of left lower leg, initial encounter: Secondary | ICD-10-CM | POA: Diagnosis not present

## 2017-09-06 DIAGNOSIS — S99912A Unspecified injury of left ankle, initial encounter: Secondary | ICD-10-CM | POA: Diagnosis not present

## 2017-09-06 NOTE — Patient Instructions (Signed)
Take tylenol as needed for pain.  Ice massage for 5-10 minutes a few times a day.  You can put water in a paper cup and freeze it and gently massage your ankle. Elevate your ankle above your heart when possible. Don't drive with the boot on. Get your x-rays as discussed. See [place] next week for follow up.

## 2017-09-06 NOTE — Telephone Encounter (Signed)
Called and spoke with Sheri Ray this afternoon at 1500 to discuss x-ray results of her left tib-fib, left ankle, and left foot.  On my personal and independent review, I do see a small, chronic avulsion that appears old. This is at the dorsal aspect of the left distal talus.  She does report to me that at the age of 65 she did break her ankle during PE class and a local orthopedic physician had to "drill into the bone to put it back together".  She has not had any issues since that time.  No other notable findings on x-ray.  She will continue in cam walker boot.  I discussed with her that she cannot drive while wearing the boot.  We will plan to have her follow-up in 1 week for reevaluation or sooner as needed.    Mort Sawyers, MD Primary Care Sports Medicine Fellow [facility]

## 2017-09-06 NOTE — Progress Notes (Signed)
Chief complaint: Left ankle pain x1 week  History of present illness: Sheri Ray is a 72 year old female who presents to sports medicine office today with chief complaint of left ankle pain.  She reports the symptoms have been present for 1 week now.  She reports a week ago while on a business trip in [place], she was walking down some steps in the house she was staying at and unfortunately missed the last step.  She describes having an inversion injury to her left ankle.  She did have immediate pain and swelling in her left ankle and had pain with weightbearing on the left side.  She did go to a local emergency department in [place] on the same day.  X-rays were done there which did not show any acute bony abnormalities. She was told that there were no fractures.  Given that she was going to fly back home here, they did do peripheral venous ultrasound to rule out DVT, which fortunately was negative.  Given the pain and swelling that she had, she was fitted with a CAM walker boot and was advised to follow-up here after returning to the [place].  She reports that symptoms have been the same over the last week.  She reports pain with any type of standing or walking.  She also reports of pain with any type of left ankle inversion.  She does not report of any interval injury or trauma.  She does not report of any numbness, tingling, or burning paresthesias.  They did prescribe her diclofenac, which she reports is not really helped so much.  They also advised her take Lovenox injections into her abdomen twice daily to prevent DVT.  She does not report of any previous left ankle injury or trauma.  She does report of occasionally pain does wake her up from sleep at nighttime.  Rest and elevation are otherwise alleviating factors.  Currently today, she rates the pain as 8/10, throbbing, aching, and occasionally sharp and nonradiating.  She does not report of any fevers, chills, night sweats, or any unintentional  weight loss.   Review of systems:  As stated above  Her past medical history, surgical history, family history, and social history obtained and reviewed.  Her past medical history is notable for hypothyroidism, hyperlipidemia, sinus bradycardia, migraine, and depression; surgical history notable for tonsillectomy, hand surgery, finger surgery, and ankle fracture surgery on the right side; she does not report of any current tobacco use; family history is notable for colon cancer; allergies and medications are reviewed and are reflected in EMR.  Physical exam: Vital signs are reviewed and are documented in the chart Gen.: Alert, oriented, appears stated age, in no apparent distress HEENT: Moist oral mucosa Respiratory: Normal respirations, able to speak in full sentences Cardiac: Regular rate, distal pulses 2+ Integumentary: No rashes on visible skin:  Neurologic: She does have intact strength with left ankle dorsiflexion, plantar flexion, inversion, eversion, left great toe dorsiflexion, left great toe plantar flexion,  sensation 2+ in bilateral lower extremities Psych: Normal affect, mood is described as good Musculoskeletal: Inspection of the left foot and ankle reveal that she does have moderate amount of puffiness diffusely over the anterior lateral ankle, no warmth or erythema noted, she does have some ecchymosis noted over the dorsum of the left foot near the second, third, fourth MTP and toes, no signs of ankle instability is anterior drawer and talar tilt are negative, she does report of pain with talar tilt specifically with more of  an inversion, she does have full ankle range of motion but does report of pain with extremes of both dorsiflexion and inversion as well as plantar flexion and inversion, she does have tenderness in multiple areas of the ankle joint including anteriorly and laterally, she is tender over the anterolateral ankle ligaments, no tenderness over the medial or lateral  malleolus itself, no tenderness over the anterior talus, no tenderness over the peroneals, slight tenderness over the base of the fifth metatarsal, slight tenderness also over the N spot, no tenderness over Lisfranc, she does walk with an antalgic gait favoring the left side, she does ambulate with assistance of 2 wooden canes that she typically uses for hiking  Limited musculoskeletal ultrasound was performed in the office today of her left ankle -She does have unremarkable appearing peroneal tendons, no evidence of peroneal strain, subluxation, or dislocation -Unremarkable appearing tibiotalar joint -Slight spurring noted over the lateral talus, slight hypoechogenic changes noted near the base of fifth metatarsal where the peroneal brevis attaches to the base of the fifth metatarsal -Multiple areas of soft tissue, subcutaneous edema noted over the anterolateral ankle -Unremarkable appearing navicular   Impression: Suspected anterolateral high-grade ankle sprain, cannot rule out injury near the base of the 5th metatarsal  Ultrasound performed and interpreted by: Mort Sawyers, MD  Assessment and plan: 1.  Acute left ankle and foot pain and swelling, status post inversion injury 1 week ago  Plan: It sounds as though Sheri Ray has a very bad sprain of her left anterolateral ankle ligaments.  She is still having antalgic gait and is more hyperesthetic on examination today.  Given the amount of puffiness that she still has in her ankle and pain with weightbearing, I am concerned for potential fracture. Given this, I would like to order for repeat x-rays today.  I will order for 3 views of the left foot to include AP, lateral, oblique.  I will also order for 3 views of the left ankle to include AP, lateral and oblique.  I will also order for 2 views of her left tib-fib to include AP and lateral.  She was still be in a CAM walker boot, weightbearing as tolerated.  Discussed for now to just use  Tylenol for pain and discontinue the diclofenac, in the event that there is a fracture, as NSAIDs can delay bone healing.  Discussed elevation and aggressive cryotherapy.  She will return in 1 week for follow-up or sooner as needed.   Mort Sawyers, M.D. [facility]

## 2017-09-07 NOTE — Addendum Note (Signed)
Addended by: Jolinda Croak E on: 09/07/2017 10:32 AM   Modules accepted: Orders

## 2017-09-13 ENCOUNTER — Encounter: Payer: Self-pay | Admitting: Family Medicine

## 2017-09-13 ENCOUNTER — Ambulatory Visit (INDEPENDENT_AMBULATORY_CARE_PROVIDER_SITE_OTHER): Payer: Medicare HMO | Admitting: Family Medicine

## 2017-09-13 VITALS — BP 124/84

## 2017-09-13 DIAGNOSIS — S93402D Sprain of unspecified ligament of left ankle, subsequent encounter: Secondary | ICD-10-CM | POA: Diagnosis not present

## 2017-09-13 NOTE — Progress Notes (Signed)
Chief complaint: Follow-up of left ankle pain x2 weeks  History of present illness: Sheri Ray is a 72 year old female who presents to sports medicine office today for follow-up of left ankle pain.  She presented here a week ago for initial presentation and evaluation of symptoms.  In brief review, 2 weeks ago while on a business trip in [place], she was walking downstairs and had a misstep on the last step.  She did suffer an inversion injury.  Initial x-rays done in [place] did not show any acute bony abnormalities.  She was given a CAM walker boot as well as given prescription for anticoagulation to prevent DVT/PE.  When she presented here to the office, repeat x-rays were done, which fortunately did not show any acute bony abnormalities.  Symptoms were suggestive of severe ankle sprain.  Given inability to weight-bear without pain, she was advised to continue with CAM walker boot.  Fortunately today she does report of interval improvement in symptoms.  She reports that the swelling has diminished.  She has been using the boot, icing, elevating, and using Tylenol occasionally.  She reports that she did not go on the family reunion trip last week and stayed home.  She reports still feeling pain more in the anterior lateral ankle, describes the pain as a throbbing, aching, and occasionally sharp pain.  She does not report of any interval injury or trauma.  She does not report of any numbness, tingling, burning paresthesias.  She has been using crutches instead of her walking sticks over the last couple of days.  Review of systems:  As stated above  Her past medical history, surgical history, family history, and social history obtained and reviewed.  Her past medical history is notable for hypothyroidism, hyperlipidemia, sinus bradycardia, migraine, and depression; surgical history notable for tonsillectomy, hand surgery, finger surgery, and ankle fracture surgery on the right side; she does not report of any  current tobacco use; family history is notable for colon cancer; allergies and medications are reviewed and are reflected in EMR.  Physical exam: Vital signs are reviewed and are documented in the chart Gen.: Alert, oriented, appears stated age, in no apparent distress HEENT: Moist oral mucosa Respiratory: Normal respirations, able to speak in full sentences Cardiac: Regular rate, distal pulses 2+ Integumentary: No rashes on visible skin:  Neurologic: She does have intact strength with ankle dorsiflexion, plantar flexion, inversion, eversion, she does have pain with the combination of dorsiflexion and eversion, sensation 2+ in bilateral lower extremities Psych: Normal affect, mood is described as good Musculoskeletal: Inspection of her left ankle and foot reveal no obvious deformity or muscle atrophy, she does have interval improvement in terms of puffiness in the anterior lateral ankle, this is essentially resolved today, she still has some healing ecchymosis noted over the dorsum of the left foot near the second, third, and fourth MTPs and toes, she is tender to palpation over the anterolateral ankle ligaments, no tenderness over the syndesmotic region, no tenderness to palpation over the proximal fibular head, no tenderness to palpation over the anterior talus, she does have slight tenderness to palpation at the lateral malleolus, no tenderness to palpation directly over the medial malleolus, no tenderness over the navicular, Lisfranc, or base of fifth metatarsal, no tenderness to palpation over the N spot, she does have full ankle range of motion but does report a pain specifically with dorsiflexion, inversion, and eversion, no signs of ligamentous instability is anterior drawer and talar tilt are negative  Assessment and  plan: 1.  Left ankle pain x2 weeks, status post inversion injury, suspect secondary to high-grade anterolateral ankle sprain  Plan: Discussed with Tamora today that that it  seems as though things are getting better for her.  She does not have as much swelling today and she does report of less pain compared to last week.  Discussed trial of having her walk around an ASO today in the office.  This was fitted to her satisfaction and she does report not having any pain with weightbearing on the left side with the ASO brace.  Discussed to transition into this and have her discontinue the CAM walker boot, as I do not want her left ankle to get stiff.  Also discussed gentle range of motion exercises with her ankle.  She can continue Tylenol for pain.  She also reports near the end of the examination today that her right shoulder is starting to get aggravated from the combination of using the crutches as well as laying on the right side.  She reports that she went to a local urgent care back in December and was diagnosed with right rotator cuff impingement and was given a cortisone injection as well as some exercises.  Discussed to see how she does with the discontinuation of the crutches and focusing not laying on the right side. Will also provide some additional HEP focusing on rotator cuff strengthening. If this is still an issue, will address this next week.  Discussed she can continue using Tylenol for this.  Will plan to have her return in 1 week for follow-up or sooner as needed.   Mort Sawyers, M.D. [facility]

## 2017-09-20 ENCOUNTER — Ambulatory Visit: Payer: Medicare HMO | Admitting: Family Medicine

## 2017-09-20 DIAGNOSIS — S99912D Unspecified injury of left ankle, subsequent encounter: Secondary | ICD-10-CM

## 2017-09-20 NOTE — Patient Instructions (Signed)
You have a Grade 3 ankle sprain. Ice the area for 15 minutes at a time, 3-4 times a day Voltaren twice a day with food as needed for pain and inflammation. You can take the hydrocodone as needed for severe pain. Elevate above the level of your heart when possible Crutches if needed to help with walking Bear weight as tolerated Use brace when up and walking around to help with stability while you recover from this injury. Come out of the brace twice a day to do Up/down and alphabet exercises 2-3 sets of each. Start theraband strengthening exercises - once a day 3 sets of 10. Start physical therapy. Follow up in 4 weeks.  Start theraband exercises for your shoulder also - 3 sets of 10 once a day.

## 2017-09-21 ENCOUNTER — Encounter: Payer: Self-pay | Admitting: Family Medicine

## 2017-09-21 DIAGNOSIS — S99912D Unspecified injury of left ankle, subsequent encounter: Secondary | ICD-10-CM | POA: Insufficient documentation

## 2017-09-21 NOTE — Progress Notes (Signed)
PCP: Mayra Neer, MD  Subjective:   HPI: Patient is a 72 y.o. female here for left ankle injury.  6/19: Sheri Ray is a 72 year old female who presents to sports medicine office today for follow-up of left ankle pain.  She presented here a week ago for initial presentation and evaluation of symptoms.  In brief review, 2 weeks ago while on a business trip in [place], she was walking downstairs and had a misstep on the last step.  She did suffer an inversion injury.  Initial x-rays done in [place] did not show any acute bony abnormalities.  She was given a CAM walker boot as well as given prescription for anticoagulation to prevent DVT/PE.  When she presented here to the office, repeat x-rays were done, which fortunately did not show any acute bony abnormalities.  Symptoms were suggestive of severe ankle sprain.  Given inability to weight-bear without pain, she was advised to continue with CAM walker boot.  Fortunately today she does report of interval improvement in symptoms.  She reports that the swelling has diminished.  She has been using the boot, icing, elevating, and using Tylenol occasionally.  She reports that she did not go on the family reunion trip last week and stayed home.  She reports still feeling pain more in the anterior lateral ankle, describes the pain as a throbbing, aching, and occasionally sharp pain.  She does not report of any interval injury or trauma.  She does not report of any numbness, tingling, burning paresthesias.  She has been using crutches instead of her walking sticks over the last couple of days.  6/26: Patient reports she's improved since last visit. She has been wearing ASO, using crutches. Still with swelling. Pain lateral ankle into plantar foot. Tylenol not seemed to help her enough. Has been resting, staying off the ankle. She had also reported pain in her right shoulder but states more recently this has improved quite a bit. No skin changes,  numbness.  Past Medical History:  Diagnosis Date  . Allergy   . Arthritis   . Cancer ([place])   . Thyroid disease     Current Outpatient Medications on File Prior to Visit  Medication Sig Dispense Refill  . aspirin EC 81 MG tablet Take by mouth.    Marland Kitchen buPROPion (WELLBUTRIN XL) 150 MG 24 hr tablet TAKE 1 TABLET BY MOUTH EVERY DAY IN THE MORNING  3  . Calcium-Vitamin D 600-200 MG-UNIT per tablet Take 1 tablet by mouth 2 (two) times daily.    . citalopram (CELEXA) 20 MG tablet Take 20 mg by mouth daily.    . Cyanocobalamin (B-12 PO) Take 1 tablet by mouth daily.    . diclofenac (VOLTAREN) 50 MG EC tablet Take 50 mg by mouth 2 (two) times daily.  0  . GARLIC PO Take 1 tablet by mouth daily.    . methylphenidate 18 MG PO CR tablet TAKE 1 TABLET BY MOUTH EVERY DAY IN THE MORNING  0  . Misc Natural Products (CURCUMAX PRO) TABS Take 1 tablet by mouth daily.    . Omega-3 Fatty Acids (FISH OIL PO) Take 1 capsule by mouth daily.    . SUMAtriptan (IMITREX) 100 MG tablet TAKE 1 TABLET BY MOUTH AS NEEDED FOR HEADACHE  0  . thyroid (ARMOUR THYROID) 60 MG tablet Take 30-60 mg by mouth See admin instructions. TAKES 30MG  ONLY ON MON'S  TAKES 60MG  ALL OTHER DAYS    . Vitamin E (VITA-PLUS E PO) Take 1 capsule  by mouth daily.     No current facility-administered medications on file prior to visit.     Past Surgical History:  Procedure Laterality Date  . ANKLE FRACTURE SURGERY    . FINGER SURGERY    . HAND SURGERY     right hand  . TONSILLECTOMY      Allergies  Allergen Reactions  . Latex Itching    Burning, itching, pain    Social History   Socioeconomic History  . Marital status: Legally Separated    Spouse name: Not on file  . Number of children: Not on file  . Years of education: Not on file  . Highest education level: Not on file  Occupational History  . Not on file  Social Needs  . Financial resource strain: Not on file  . Food insecurity:    Worry: Not on file    Inability:  Not on file  . Transportation needs:    Medical: Not on file    Non-medical: Not on file  Tobacco Use  . Smoking status: Never Smoker  . Smokeless tobacco: Never Used  Substance and Sexual Activity  . Alcohol use: Yes    Alcohol/week: 0.0 oz  . Drug use: No  . Sexual activity: Not on file  Lifestyle  . Physical activity:    Days per week: Not on file    Minutes per session: Not on file  . Stress: Not on file  Relationships  . Social connections:    Talks on phone: Not on file    Gets together: Not on file    Attends religious service: Not on file    Active member of club or organization: Not on file    Attends meetings of clubs or organizations: Not on file    Relationship status: Not on file  . Intimate partner violence:    Fear of current or ex partner: Not on file    Emotionally abused: Not on file    Physically abused: Not on file    Forced sexual activity: Not on file  Other Topics Concern  . Not on file  Social History Narrative  . Not on file    Family History  Problem Relation Age of Onset  . Colon cancer Neg Hx     BP (!) 166/85   Ht 5\' 3"  (1.6 m)   Wt 140 lb (63.5 kg)   BMI 24.80 kg/m   Review of Systems: See HPI above.     Objective:  Physical Exam:  Gen: NAD, comfortable in exam room  Left ankle: Mild lateral swelling.  No bruising, other deformity. FROM with 5/5 strength all motions. TTP plantar fascia, over ATFL.  No other tenderness currently. 1+ ant drawer and negative talar tilt.   Negative syndesmotic compression. Thompsons test negative. NV intact distally.  Right shoulder: No swelling, ecchymoses.  No gross deformity. No TTP. FROM. Negative Hawkins, Neers. Negative Yergasons. Strength 5/5 with empty can and resisted internal/external rotation. Negative apprehension. NV intact distally.   Assessment & Plan:  1. Left ankle injury - 2/2 lateral ankle sprain.  Has transitioned out of the boot and is now in ASO.  Voltaren for  pain and inflammation, icing if needed.  Shown home exercises to do daily and will start physical therapy.  F/u in 4 weeks.  We also reviewed basic theraband strengthening exercises for her shoulder though exam is normal today.

## 2017-09-21 NOTE — Assessment & Plan Note (Signed)
2/2 lateral ankle sprain.  Has transitioned out of the boot and is now in ASO.  Voltaren for pain and inflammation, icing if needed.  Shown home exercises to do daily and will start physical therapy.  F/u in 4 weeks.

## 2017-09-22 DIAGNOSIS — R030 Elevated blood-pressure reading, without diagnosis of hypertension: Secondary | ICD-10-CM | POA: Diagnosis not present

## 2017-09-25 DIAGNOSIS — R269 Unspecified abnormalities of gait and mobility: Secondary | ICD-10-CM | POA: Diagnosis not present

## 2017-09-25 DIAGNOSIS — M25672 Stiffness of left ankle, not elsewhere classified: Secondary | ICD-10-CM | POA: Diagnosis not present

## 2017-09-25 DIAGNOSIS — M25472 Effusion, left ankle: Secondary | ICD-10-CM | POA: Diagnosis not present

## 2017-09-25 DIAGNOSIS — M25572 Pain in left ankle and joints of left foot: Secondary | ICD-10-CM | POA: Diagnosis not present

## 2017-09-27 DIAGNOSIS — R69 Illness, unspecified: Secondary | ICD-10-CM | POA: Diagnosis not present

## 2017-09-27 DIAGNOSIS — F411 Generalized anxiety disorder: Secondary | ICD-10-CM | POA: Diagnosis not present

## 2017-09-29 DIAGNOSIS — M25572 Pain in left ankle and joints of left foot: Secondary | ICD-10-CM | POA: Diagnosis not present

## 2017-09-29 DIAGNOSIS — M25472 Effusion, left ankle: Secondary | ICD-10-CM | POA: Diagnosis not present

## 2017-09-29 DIAGNOSIS — R269 Unspecified abnormalities of gait and mobility: Secondary | ICD-10-CM | POA: Diagnosis not present

## 2017-09-29 DIAGNOSIS — M25672 Stiffness of left ankle, not elsewhere classified: Secondary | ICD-10-CM | POA: Diagnosis not present

## 2017-10-02 DIAGNOSIS — M25472 Effusion, left ankle: Secondary | ICD-10-CM | POA: Diagnosis not present

## 2017-10-02 DIAGNOSIS — M25572 Pain in left ankle and joints of left foot: Secondary | ICD-10-CM | POA: Diagnosis not present

## 2017-10-02 DIAGNOSIS — M25672 Stiffness of left ankle, not elsewhere classified: Secondary | ICD-10-CM | POA: Diagnosis not present

## 2017-10-02 DIAGNOSIS — R269 Unspecified abnormalities of gait and mobility: Secondary | ICD-10-CM | POA: Diagnosis not present

## 2017-10-04 DIAGNOSIS — R269 Unspecified abnormalities of gait and mobility: Secondary | ICD-10-CM | POA: Diagnosis not present

## 2017-10-04 DIAGNOSIS — F411 Generalized anxiety disorder: Secondary | ICD-10-CM | POA: Diagnosis not present

## 2017-10-04 DIAGNOSIS — M25672 Stiffness of left ankle, not elsewhere classified: Secondary | ICD-10-CM | POA: Diagnosis not present

## 2017-10-04 DIAGNOSIS — Z1231 Encounter for screening mammogram for malignant neoplasm of breast: Secondary | ICD-10-CM | POA: Diagnosis not present

## 2017-10-04 DIAGNOSIS — M25572 Pain in left ankle and joints of left foot: Secondary | ICD-10-CM | POA: Diagnosis not present

## 2017-10-04 DIAGNOSIS — R69 Illness, unspecified: Secondary | ICD-10-CM | POA: Diagnosis not present

## 2017-10-04 DIAGNOSIS — M25472 Effusion, left ankle: Secondary | ICD-10-CM | POA: Diagnosis not present

## 2017-10-09 DIAGNOSIS — M25472 Effusion, left ankle: Secondary | ICD-10-CM | POA: Diagnosis not present

## 2017-10-09 DIAGNOSIS — M79641 Pain in right hand: Secondary | ICD-10-CM | POA: Diagnosis not present

## 2017-10-09 DIAGNOSIS — M1990 Unspecified osteoarthritis, unspecified site: Secondary | ICD-10-CM | POA: Diagnosis not present

## 2017-10-09 DIAGNOSIS — M19042 Primary osteoarthritis, left hand: Secondary | ICD-10-CM | POA: Diagnosis not present

## 2017-10-09 DIAGNOSIS — R269 Unspecified abnormalities of gait and mobility: Secondary | ICD-10-CM | POA: Diagnosis not present

## 2017-10-09 DIAGNOSIS — M19041 Primary osteoarthritis, right hand: Secondary | ICD-10-CM | POA: Diagnosis not present

## 2017-10-09 DIAGNOSIS — M064 Inflammatory polyarthropathy: Secondary | ICD-10-CM | POA: Diagnosis not present

## 2017-10-09 DIAGNOSIS — R768 Other specified abnormal immunological findings in serum: Secondary | ICD-10-CM | POA: Diagnosis not present

## 2017-10-09 DIAGNOSIS — M25672 Stiffness of left ankle, not elsewhere classified: Secondary | ICD-10-CM | POA: Diagnosis not present

## 2017-10-09 DIAGNOSIS — M25572 Pain in left ankle and joints of left foot: Secondary | ICD-10-CM | POA: Diagnosis not present

## 2017-10-09 DIAGNOSIS — M79642 Pain in left hand: Secondary | ICD-10-CM | POA: Diagnosis not present

## 2017-10-11 DIAGNOSIS — R69 Illness, unspecified: Secondary | ICD-10-CM | POA: Diagnosis not present

## 2017-10-11 DIAGNOSIS — R269 Unspecified abnormalities of gait and mobility: Secondary | ICD-10-CM | POA: Diagnosis not present

## 2017-10-11 DIAGNOSIS — M25472 Effusion, left ankle: Secondary | ICD-10-CM | POA: Diagnosis not present

## 2017-10-11 DIAGNOSIS — M25672 Stiffness of left ankle, not elsewhere classified: Secondary | ICD-10-CM | POA: Diagnosis not present

## 2017-10-11 DIAGNOSIS — F411 Generalized anxiety disorder: Secondary | ICD-10-CM | POA: Diagnosis not present

## 2017-10-11 DIAGNOSIS — M25572 Pain in left ankle and joints of left foot: Secondary | ICD-10-CM | POA: Diagnosis not present

## 2017-10-16 DIAGNOSIS — M25572 Pain in left ankle and joints of left foot: Secondary | ICD-10-CM | POA: Diagnosis not present

## 2017-10-16 DIAGNOSIS — M25672 Stiffness of left ankle, not elsewhere classified: Secondary | ICD-10-CM | POA: Diagnosis not present

## 2017-10-16 DIAGNOSIS — M25472 Effusion, left ankle: Secondary | ICD-10-CM | POA: Diagnosis not present

## 2017-10-16 DIAGNOSIS — R269 Unspecified abnormalities of gait and mobility: Secondary | ICD-10-CM | POA: Diagnosis not present

## 2017-10-18 DIAGNOSIS — M25472 Effusion, left ankle: Secondary | ICD-10-CM | POA: Diagnosis not present

## 2017-10-18 DIAGNOSIS — M25572 Pain in left ankle and joints of left foot: Secondary | ICD-10-CM | POA: Diagnosis not present

## 2017-10-18 DIAGNOSIS — R69 Illness, unspecified: Secondary | ICD-10-CM | POA: Diagnosis not present

## 2017-10-18 DIAGNOSIS — R269 Unspecified abnormalities of gait and mobility: Secondary | ICD-10-CM | POA: Diagnosis not present

## 2017-10-18 DIAGNOSIS — F411 Generalized anxiety disorder: Secondary | ICD-10-CM | POA: Diagnosis not present

## 2017-10-18 DIAGNOSIS — M25672 Stiffness of left ankle, not elsewhere classified: Secondary | ICD-10-CM | POA: Diagnosis not present

## 2017-10-19 ENCOUNTER — Ambulatory Visit: Payer: Medicare HMO | Admitting: Sports Medicine

## 2017-10-20 DIAGNOSIS — M8589 Other specified disorders of bone density and structure, multiple sites: Secondary | ICD-10-CM | POA: Diagnosis not present

## 2017-10-23 DIAGNOSIS — M1990 Unspecified osteoarthritis, unspecified site: Secondary | ICD-10-CM | POA: Diagnosis not present

## 2017-10-23 DIAGNOSIS — M064 Inflammatory polyarthropathy: Secondary | ICD-10-CM | POA: Diagnosis not present

## 2017-10-23 DIAGNOSIS — R269 Unspecified abnormalities of gait and mobility: Secondary | ICD-10-CM | POA: Diagnosis not present

## 2017-10-23 DIAGNOSIS — M25472 Effusion, left ankle: Secondary | ICD-10-CM | POA: Diagnosis not present

## 2017-10-23 DIAGNOSIS — M25672 Stiffness of left ankle, not elsewhere classified: Secondary | ICD-10-CM | POA: Diagnosis not present

## 2017-10-23 DIAGNOSIS — M79641 Pain in right hand: Secondary | ICD-10-CM | POA: Diagnosis not present

## 2017-10-23 DIAGNOSIS — R768 Other specified abnormal immunological findings in serum: Secondary | ICD-10-CM | POA: Diagnosis not present

## 2017-10-23 DIAGNOSIS — M25572 Pain in left ankle and joints of left foot: Secondary | ICD-10-CM | POA: Diagnosis not present

## 2017-10-24 ENCOUNTER — Ambulatory Visit: Payer: Medicare HMO | Admitting: Sports Medicine

## 2017-10-25 ENCOUNTER — Ambulatory Visit: Payer: Medicare HMO | Admitting: Sports Medicine

## 2017-10-25 VITALS — BP 128/84 | Ht 62.5 in | Wt 140.0 lb

## 2017-10-25 DIAGNOSIS — M25672 Stiffness of left ankle, not elsewhere classified: Secondary | ICD-10-CM | POA: Diagnosis not present

## 2017-10-25 DIAGNOSIS — S93402D Sprain of unspecified ligament of left ankle, subsequent encounter: Secondary | ICD-10-CM

## 2017-10-25 DIAGNOSIS — M25572 Pain in left ankle and joints of left foot: Secondary | ICD-10-CM | POA: Diagnosis not present

## 2017-10-25 DIAGNOSIS — R69 Illness, unspecified: Secondary | ICD-10-CM | POA: Diagnosis not present

## 2017-10-25 DIAGNOSIS — F411 Generalized anxiety disorder: Secondary | ICD-10-CM | POA: Diagnosis not present

## 2017-10-25 DIAGNOSIS — M25472 Effusion, left ankle: Secondary | ICD-10-CM | POA: Diagnosis not present

## 2017-10-25 DIAGNOSIS — R269 Unspecified abnormalities of gait and mobility: Secondary | ICD-10-CM | POA: Diagnosis not present

## 2017-10-25 NOTE — Progress Notes (Signed)
   HPI   CC: Left ankle pain  Sheri Ray is a 71 year old female who presents for follow-up of left ankle sprain.  On June 5 while in [place].  She had an inversion injury which resulted in ankle sprain at that time.  She was put in a cam walker boot given anticoagulation.  She was then seen by Dr. Raynelle Bring here in this office on June 19.  She was switched to an ASO brace and was started in physical therapy.  She was seen for follow-up with Dr. Barbaraann Barthel on June 26.  Since that time she states that she has vast improvement in her pain and range of motion.  She has been going to physical therapy twice a week with improvement.  Not required any NSAIDs or any pain management.  She states only time she explains any troubles when she goes upstairs, which she states is only mild pain.  She has not required any Voltaren cream.  She reports some mild swelling still in the ankle.  Denies any numbness and tingling of the foot.  See HPI and/or previous note for associated ROS.  Objective: BP 128/84   Ht 5' 2.5" (1.588 m)   Wt 140 lb (63.5 kg)   BMI 25.20 kg/m  Gen: NAD, well groomed,normal affect.  CV: Well-perfused. Warm.  Resp: Non-labored.  Neuro: Sensation intact throughout. No gross coordination deficits.  Gait: Nonpathologic posture, unremarkable stride without signs of limp or balance issues.  Left ankle exam: No erythema or warmth.  Mild swelling on the lateral Ray of ankle.  Mild tenderness to palpation along the extensor digitorum longus as it crosses over the tibio-talar joint.  Full passive and active range of motion in dorsiflexion, plantarflexion, eversion, inversion.  5 out of 5 strength throughout testing.  Negative anterior drawer.  Brief neuro exam: Patient is neurovascularly intact.  Patellar and Achilles reflexes intact.  ULTRASOUND: Ankle, left  - Ankle joint: Medial and lateral ankle joint observed with preserved joint space, and no evidence of irregularity to the articular  surface. No effusion. No evidence of fracture. No obvious osteophyte development. - Talar dome: Talar dome observed with preserved medial and lateral joint space and no evidence of irregularity to the articular surface. No evidence of fracture or obvious osteophyte development. -Extensor tendons: Anterior tibialis tendon observed without evidence of edema or tearing. Anterior tibialis insertion noted and intact.  Extensor digitorum longus with trace evidence of edema without obvious tearing.   IMPRESSION: findings consistent with healing ankle sprain.   Assessment and plan: Left Lateral Ankle Sprain.  Sheri Ray seems to be recovering well from her injury.  She has minimal pain at this time, and has been able to resume back to her regular activities.  I asked her to let pain be her guide if she continues to increase the amount of activity she is doing.  She can continue with Voltaren cream as needed over the affected area.  She should be in the ASO brace only during strenuous exercise.  She can continue with physical therapy for continued range of motion improvement.  Advised her if she does get worse, or reinjure the area to call our office to be seen.  Otherwise, she can follow-up as needed.  Lewanda Rife, MD [facility] 10/25/2017 9:36 AM

## 2017-10-30 DIAGNOSIS — M25472 Effusion, left ankle: Secondary | ICD-10-CM | POA: Diagnosis not present

## 2017-10-30 DIAGNOSIS — M25672 Stiffness of left ankle, not elsewhere classified: Secondary | ICD-10-CM | POA: Diagnosis not present

## 2017-10-30 DIAGNOSIS — M25572 Pain in left ankle and joints of left foot: Secondary | ICD-10-CM | POA: Diagnosis not present

## 2017-10-30 DIAGNOSIS — R69 Illness, unspecified: Secondary | ICD-10-CM | POA: Diagnosis not present

## 2017-10-30 DIAGNOSIS — F411 Generalized anxiety disorder: Secondary | ICD-10-CM | POA: Diagnosis not present

## 2017-10-30 DIAGNOSIS — R269 Unspecified abnormalities of gait and mobility: Secondary | ICD-10-CM | POA: Diagnosis not present

## 2017-10-31 DIAGNOSIS — D2271 Melanocytic nevi of right lower limb, including hip: Secondary | ICD-10-CM | POA: Diagnosis not present

## 2017-10-31 DIAGNOSIS — D2262 Melanocytic nevi of left upper limb, including shoulder: Secondary | ICD-10-CM | POA: Diagnosis not present

## 2017-10-31 DIAGNOSIS — L820 Inflamed seborrheic keratosis: Secondary | ICD-10-CM | POA: Diagnosis not present

## 2017-10-31 DIAGNOSIS — L821 Other seborrheic keratosis: Secondary | ICD-10-CM | POA: Diagnosis not present

## 2017-10-31 DIAGNOSIS — D1801 Hemangioma of skin and subcutaneous tissue: Secondary | ICD-10-CM | POA: Diagnosis not present

## 2017-10-31 DIAGNOSIS — D225 Melanocytic nevi of trunk: Secondary | ICD-10-CM | POA: Diagnosis not present

## 2017-10-31 DIAGNOSIS — D2272 Melanocytic nevi of left lower limb, including hip: Secondary | ICD-10-CM | POA: Diagnosis not present

## 2017-10-31 DIAGNOSIS — Z85828 Personal history of other malignant neoplasm of skin: Secondary | ICD-10-CM | POA: Diagnosis not present

## 2017-11-02 DIAGNOSIS — M25672 Stiffness of left ankle, not elsewhere classified: Secondary | ICD-10-CM | POA: Diagnosis not present

## 2017-11-02 DIAGNOSIS — M25472 Effusion, left ankle: Secondary | ICD-10-CM | POA: Diagnosis not present

## 2017-11-02 DIAGNOSIS — R269 Unspecified abnormalities of gait and mobility: Secondary | ICD-10-CM | POA: Diagnosis not present

## 2017-11-02 DIAGNOSIS — M25572 Pain in left ankle and joints of left foot: Secondary | ICD-10-CM | POA: Diagnosis not present

## 2017-11-06 DIAGNOSIS — R69 Illness, unspecified: Secondary | ICD-10-CM | POA: Diagnosis not present

## 2017-11-06 DIAGNOSIS — F411 Generalized anxiety disorder: Secondary | ICD-10-CM | POA: Diagnosis not present

## 2017-11-13 ENCOUNTER — Ambulatory Visit: Payer: Medicare HMO | Admitting: Podiatry

## 2017-11-13 DIAGNOSIS — L989 Disorder of the skin and subcutaneous tissue, unspecified: Secondary | ICD-10-CM

## 2017-11-13 DIAGNOSIS — M7672 Peroneal tendinitis, left leg: Secondary | ICD-10-CM

## 2017-11-14 DIAGNOSIS — R69 Illness, unspecified: Secondary | ICD-10-CM | POA: Diagnosis not present

## 2017-11-14 DIAGNOSIS — F411 Generalized anxiety disorder: Secondary | ICD-10-CM | POA: Diagnosis not present

## 2017-11-15 NOTE — Progress Notes (Signed)
   HPI: 72 year old female presenting today for follow up evaluation of insertional peroneal tendinitis of the left foot. She reports some relief of the pain after receiving the injection at the previous visit. She also notes painful callus lesions of the left foot that cause pain while ambulating in shoes that have been present for the past few months. She has not done anything for treatment. Patient is here for further evaluation and treatment.   Past Medical History:  Diagnosis Date  . Allergy   . Arthritis   . Cancer ([place])   . Thyroid disease      Physical Exam: General: The patient is alert and oriented x3 in no acute distress.  Dermatology: Hyperkeratotic lesions present on the left foot x 2. Pain on palpation with a central nucleated core noted. Skin is warm, dry and supple bilateral lower extremities. Negative for open lesions or macerations.  Vascular: Palpable pedal pulses bilaterally. No edema or erythema noted. Capillary refill within normal limits.  Neurological: Epicritic and protective threshold grossly intact bilaterally.   Musculoskeletal Exam: Pain with palpation to the insertion of the peroneal tendon of the left foot. Range of motion within normal limits to all pedal and ankle joints bilateral. Muscle strength 5/5 in all groups bilateral.   Assessment: 1. Insertional peroneal tendinitis left 2. Porokeratosis left foot x 2   Plan of Care:  1. Patient evaluated.   2. Injection of 0.5 mLs Celestone Soluspan injected into the insertion of the peroneal tendon sheath of the left foot.  3. Recommended good shoe gear.  4. Excisional debridement of keratoic lesion using a chisel blade was performed without incident. Light dressing applied.  5. Return to clinic as needed.      Edrick Kins, DPM [facility]  Dr. Edrick Kins, DPM    [address], [place] [address]                Office [phone]    Fax [phone]

## 2017-11-16 ENCOUNTER — Ambulatory Visit: Payer: Medicare HMO | Admitting: Sports Medicine

## 2017-11-20 DIAGNOSIS — F411 Generalized anxiety disorder: Secondary | ICD-10-CM | POA: Diagnosis not present

## 2017-11-20 DIAGNOSIS — R69 Illness, unspecified: Secondary | ICD-10-CM | POA: Diagnosis not present

## 2017-11-30 DIAGNOSIS — R69 Illness, unspecified: Secondary | ICD-10-CM | POA: Diagnosis not present

## 2017-11-30 DIAGNOSIS — F411 Generalized anxiety disorder: Secondary | ICD-10-CM | POA: Diagnosis not present

## 2017-12-07 DIAGNOSIS — F411 Generalized anxiety disorder: Secondary | ICD-10-CM | POA: Diagnosis not present

## 2017-12-07 DIAGNOSIS — R69 Illness, unspecified: Secondary | ICD-10-CM | POA: Diagnosis not present

## 2017-12-19 DIAGNOSIS — R69 Illness, unspecified: Secondary | ICD-10-CM | POA: Diagnosis not present

## 2017-12-21 DIAGNOSIS — F411 Generalized anxiety disorder: Secondary | ICD-10-CM | POA: Diagnosis not present

## 2017-12-21 DIAGNOSIS — R69 Illness, unspecified: Secondary | ICD-10-CM | POA: Diagnosis not present

## 2017-12-26 ENCOUNTER — Ambulatory Visit
Admission: RE | Admit: 2017-12-26 | Discharge: 2017-12-26 | Disposition: A | Discharge status: Home / Self Care | Payer: Medicare HMO | Source: Ambulatory Visit | Attending: Family Medicine | Admitting: Family Medicine

## 2017-12-26 ENCOUNTER — Other Ambulatory Visit: Payer: Self-pay | Admitting: Family Medicine

## 2017-12-26 DIAGNOSIS — R059 Cough, unspecified: Secondary | ICD-10-CM

## 2017-12-26 DIAGNOSIS — R05 Cough: Secondary | ICD-10-CM

## 2017-12-26 DIAGNOSIS — J209 Acute bronchitis, unspecified: Secondary | ICD-10-CM | POA: Diagnosis not present

## 2017-12-26 DIAGNOSIS — E782 Mixed hyperlipidemia: Secondary | ICD-10-CM | POA: Diagnosis not present

## 2017-12-26 DIAGNOSIS — R69 Illness, unspecified: Secondary | ICD-10-CM | POA: Diagnosis not present

## 2017-12-26 DIAGNOSIS — F411 Generalized anxiety disorder: Secondary | ICD-10-CM | POA: Diagnosis not present

## 2017-12-26 DIAGNOSIS — E039 Hypothyroidism, unspecified: Secondary | ICD-10-CM | POA: Diagnosis not present

## 2017-12-26 DIAGNOSIS — M899 Disorder of bone, unspecified: Secondary | ICD-10-CM | POA: Diagnosis not present

## 2017-12-26 DIAGNOSIS — G43909 Migraine, unspecified, not intractable, without status migrainosus: Secondary | ICD-10-CM | POA: Diagnosis not present

## 2017-12-26 DIAGNOSIS — Z23 Encounter for immunization: Secondary | ICD-10-CM | POA: Diagnosis not present

## 2017-12-26 DIAGNOSIS — Z0000 Encounter for general adult medical examination without abnormal findings: Secondary | ICD-10-CM | POA: Diagnosis not present

## 2017-12-26 DIAGNOSIS — D692 Other nonthrombocytopenic purpura: Secondary | ICD-10-CM | POA: Diagnosis not present

## 2018-01-02 DIAGNOSIS — R69 Illness, unspecified: Secondary | ICD-10-CM | POA: Diagnosis not present

## 2018-01-02 DIAGNOSIS — F411 Generalized anxiety disorder: Secondary | ICD-10-CM | POA: Diagnosis not present

## 2018-01-08 DIAGNOSIS — F411 Generalized anxiety disorder: Secondary | ICD-10-CM | POA: Diagnosis not present

## 2018-01-08 DIAGNOSIS — R69 Illness, unspecified: Secondary | ICD-10-CM | POA: Diagnosis not present

## 2018-01-10 DIAGNOSIS — R69 Illness, unspecified: Secondary | ICD-10-CM | POA: Diagnosis not present

## 2018-01-10 DIAGNOSIS — F411 Generalized anxiety disorder: Secondary | ICD-10-CM | POA: Diagnosis not present

## 2018-01-22 ENCOUNTER — Encounter: Payer: Self-pay | Admitting: Gastroenterology

## 2018-01-22 DIAGNOSIS — F411 Generalized anxiety disorder: Secondary | ICD-10-CM | POA: Diagnosis not present

## 2018-01-22 DIAGNOSIS — R69 Illness, unspecified: Secondary | ICD-10-CM | POA: Diagnosis not present

## 2018-01-29 DIAGNOSIS — R69 Illness, unspecified: Secondary | ICD-10-CM | POA: Diagnosis not present

## 2018-01-29 DIAGNOSIS — F411 Generalized anxiety disorder: Secondary | ICD-10-CM | POA: Diagnosis not present

## 2018-01-31 ENCOUNTER — Ambulatory Visit: Payer: Medicare HMO | Admitting: Podiatry

## 2018-01-31 DIAGNOSIS — M7672 Peroneal tendinitis, left leg: Secondary | ICD-10-CM | POA: Diagnosis not present

## 2018-01-31 MED ORDER — DICLOFENAC SODIUM 75 MG PO TBEC: 75.0000 mg | DELAYED_RELEASE_TABLET | Freq: Two times a day (BID) | ORAL | 0 refills | Status: DC
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
## 2018-02-05 ENCOUNTER — Telehealth: Payer: Self-pay | Admitting: Podiatry

## 2018-02-05 MED ORDER — DICLOFENAC SODIUM 50 MG PO TBEC: 50.0000 mg | DELAYED_RELEASE_TABLET | Freq: Two times a day (BID) | ORAL | 0 refills | Status: DC

## 2018-02-05 NOTE — Telephone Encounter (Signed)
Pt thinks that Dr. Amalia Hailey Prescribed the wrong medication/dosage. Please give patient a call back

## 2018-02-05 NOTE — Addendum Note (Signed)
Addended by: Harriett Sine D on: 02/05/2018 01:56 PM   Modules accepted: Orders

## 2018-02-05 NOTE — Telephone Encounter (Signed)
Pt states she received 50mg  Diclofenac from her Dr. Brigitte Pulse previously and wanted to know if the 75mg  diclofenac Dr. Amalia Hailey had prescribed was too much. I told pt the 75mg  Diclofenac was our basic order, but if she would like the dosage changed I could do that and she would need to dispose of the 75mg  Diclofenac. Pt states she would like the change.

## 2018-02-06 DIAGNOSIS — R69 Illness, unspecified: Secondary | ICD-10-CM | POA: Diagnosis not present

## 2018-02-06 DIAGNOSIS — F411 Generalized anxiety disorder: Secondary | ICD-10-CM | POA: Diagnosis not present

## 2018-02-08 ENCOUNTER — Encounter: Payer: Self-pay | Admitting: Gastroenterology

## 2018-02-13 DIAGNOSIS — F411 Generalized anxiety disorder: Secondary | ICD-10-CM | POA: Diagnosis not present

## 2018-02-13 DIAGNOSIS — R69 Illness, unspecified: Secondary | ICD-10-CM | POA: Diagnosis not present

## 2018-02-16 DIAGNOSIS — H2513 Age-related nuclear cataract, bilateral: Secondary | ICD-10-CM | POA: Diagnosis not present

## 2018-02-26 ENCOUNTER — Ambulatory Visit (AMBULATORY_SURGERY_CENTER): Payer: Self-pay | Admitting: *Deleted

## 2018-02-26 ENCOUNTER — Encounter: Payer: Self-pay | Admitting: Gastroenterology

## 2018-02-26 VITALS — Ht 62.0 in | Wt 142.0 lb

## 2018-02-26 DIAGNOSIS — Z86010 Personal history of colonic polyps: Secondary | ICD-10-CM

## 2018-02-26 MED ORDER — NA SULFATE-K SULFATE-MG SULF 17.5-3.13-1.6 GM/177ML PO SOLN
ORAL | 0 refills | Status: DC
Start: 2018-02-26 — End: 2018-03-12

## 2018-02-26 NOTE — Progress Notes (Signed)
Patient denies any allergies to eggs or soy. Patient denies any problems with anesthesia/sedation. Patient denies any oxygen use at home. Patient denies taking any diet/weight loss medications or blood thinners. Patient did not want to do the MIralax prep. She had a hard time with this during last colonoscopy per pt. Suprep given to patient, she was okay with cost.

## 2018-02-28 DIAGNOSIS — F411 Generalized anxiety disorder: Secondary | ICD-10-CM | POA: Diagnosis not present

## 2018-02-28 DIAGNOSIS — R69 Illness, unspecified: Secondary | ICD-10-CM | POA: Diagnosis not present

## 2018-03-05 ENCOUNTER — Encounter: Payer: Self-pay | Admitting: Podiatry

## 2018-03-05 ENCOUNTER — Ambulatory Visit: Payer: Medicare HMO | Admitting: Podiatry

## 2018-03-05 ENCOUNTER — Other Ambulatory Visit: Payer: Self-pay | Admitting: Podiatry

## 2018-03-05 ENCOUNTER — Ambulatory Visit (INDEPENDENT_AMBULATORY_CARE_PROVIDER_SITE_OTHER): Payer: Medicare HMO

## 2018-03-05 DIAGNOSIS — S92514A Nondisplaced fracture of proximal phalanx of right lesser toe(s), initial encounter for closed fracture: Secondary | ICD-10-CM

## 2018-03-05 DIAGNOSIS — S92504A Nondisplaced unspecified fracture of right lesser toe(s), initial encounter for closed fracture: Secondary | ICD-10-CM | POA: Diagnosis not present

## 2018-03-05 DIAGNOSIS — M79674 Pain in right toe(s): Secondary | ICD-10-CM
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
## 2018-03-07 DIAGNOSIS — R69 Illness, unspecified: Secondary | ICD-10-CM | POA: Diagnosis not present

## 2018-03-07 DIAGNOSIS — F411 Generalized anxiety disorder: Secondary | ICD-10-CM | POA: Diagnosis not present

## 2018-03-12 ENCOUNTER — Ambulatory Visit (AMBULATORY_SURGERY_CENTER): Payer: Medicare HMO | Admitting: Gastroenterology

## 2018-03-12 ENCOUNTER — Encounter: Payer: Self-pay | Admitting: Gastroenterology

## 2018-03-12 ENCOUNTER — Telehealth: Payer: Self-pay | Admitting: Gastroenterology

## 2018-03-12 VITALS — BP 166/76 | HR 66 | Temp 96.6°F | Resp 19 | Ht 62.5 in | Wt 140.0 lb

## 2018-03-12 DIAGNOSIS — D120 Benign neoplasm of cecum: Secondary | ICD-10-CM

## 2018-03-12 DIAGNOSIS — Z86010 Personal history of colonic polyps: Secondary | ICD-10-CM

## 2018-03-12 DIAGNOSIS — Z1211 Encounter for screening for malignant neoplasm of colon: Secondary | ICD-10-CM | POA: Diagnosis not present

## 2018-03-12 MED ORDER — SODIUM CHLORIDE 0.9 % IV SOLN: 500.0000 mL | Freq: Once | INTRAVENOUS | Status: DC

## 2018-03-12 NOTE — Progress Notes (Signed)
Pt's states no medical or surgical changes since previsit or office visit. 

## 2018-03-12 NOTE — Progress Notes (Signed)
Alert and oriented x3, pleased with MAC, report to RN  

## 2018-03-12 NOTE — Progress Notes (Signed)
Late note for 0908.   Pt coughing with small amount of clear secretions, oropharynx suctioned, pt maintaining airway. A few minutes later, pt BP increased and sats decreased, oropharynx suctioned, no secretions, oral airway size 8.0 atraumatically inserted, sats down to 60'S began masking patient with 100% FiO2 bagmask and asked for additional help to come to room. Additional personal in room about the  time the  patient's sats came up to 90's. Patient was then placed on a non re breather mask and oral airway removed, patient ventilating well on her own.

## 2018-03-12 NOTE — Progress Notes (Signed)
Patient complained of severe upper abd pain.  After passing gas she found relief and feels ready for discharge.

## 2018-03-12 NOTE — Progress Notes (Signed)
Called to room to assist during endoscopic procedure.  Patient ID and intended procedure confirmed with present staff. Received instructions for my participation in the procedure from the performing physician.  

## 2018-03-12 NOTE — Patient Instructions (Signed)
YOU HAD AN ENDOSCOPIC PROCEDURE TODAY AT THE [facility]:   Refer to the procedure report that was given to you for any specific questions about what was found during the examination.  If the procedure report does not answer your questions, please call your gastroenterologist to clarify.  If you requested that your care partner not be given the details of your procedure findings, then the procedure report has been included in a sealed envelope for you to review at your convenience later.  YOU SHOULD EXPECT: Some feelings of bloating in the abdomen. Passage of more gas than usual.  Walking can help get rid of the air that was put into your GI tract during the procedure and reduce the bloating. If you had a lower endoscopy (such as a colonoscopy or flexible sigmoidoscopy) you may notice spotting of blood in your stool or on the toilet paper. If you underwent a bowel prep for your procedure, you may not have a normal bowel movement for a few days.  Please Note:  You might notice some irritation and congestion in your nose or some drainage.  This is from the oxygen used during your procedure.  There is no need for concern and it should clear up in a day or so.  SYMPTOMS TO REPORT IMMEDIATELY:   Following lower endoscopy (colonoscopy or flexible sigmoidoscopy):  Excessive amounts of blood in the stool  Significant tenderness or worsening of abdominal pains  Swelling of the abdomen that is new, acute  Fever of 100F or higher  For urgent or emergent issues, a gastroenterologist can be reached at any hour by calling [phone].   DIET:  We do recommend a small meal at first, but then you may proceed to your regular diet.  Drink plenty of fluids but you should avoid alcoholic beverages for 24 hours.  ACTIVITY:  You should plan to take it easy for the rest of today and you should NOT DRIVE or use heavy machinery until tomorrow (because of the sedation medicines used during the test).     FOLLOW UP: Our staff will call the number listed on your records the next business day following your procedure to check on you and address any questions or concerns that you may have regarding the information given to you following your procedure. If we do not reach you, we will leave a message.  However, if you are feeling well and you are not experiencing any problems, there is no need to return our call.  We will assume that you have returned to your regular daily activities without incident.  If any biopsies were taken you will be contacted by phone or by letter within the next 1-3 weeks.  Please call us at [phone] if you have not heard about the biopsies in 3 weeks.    SIGNATURES/CONFIDENTIALITY: You and/or your care partner have signed paperwork which will be entered into your electronic medical record.  These signatures attest to the fact that that the information above on your After Visit Summary has been reviewed and is understood.  Full responsibility of the confidentiality of this discharge information lies with you and/or your care-partner. 

## 2018-03-12 NOTE — Op Note (Signed)
[place] Patient Name: Sheri Ray Procedure Date: 03/12/2018 8:33 AM MRN: [id number] Endoscopist: Mauri Pole , MD Age: 72 Referring MD:  Date of Birth: [birth year] Gender: Female Account #: [id number] Procedure:                Colonoscopy Indications:              Surveillance: Personal history of adenomatous                            polyps on last colonoscopy 3 years ago, High risk                            colon cancer surveillance: Personal history of                            multiple (3 or more) adenomas Medicines:                Monitored Anesthesia Care Procedure:                Pre-Anesthesia Assessment:                           - Prior to the procedure, a History and Physical                            was performed, and patient medications and                            allergies were reviewed. The patient's tolerance of                            previous anesthesia was also reviewed. The risks                            and benefits of the procedure and the sedation                            options and risks were discussed with the patient.                            All questions were answered, and informed consent                            was obtained. Prior Anticoagulants: The patient has                            taken no previous anticoagulant or antiplatelet                            agents. ASA Grade Assessment: II - A patient with                            mild systemic disease. After reviewing the risks  and benefits, the patient was deemed in                            satisfactory condition to undergo the procedure.                           After obtaining informed consent, the colonoscope                            was passed under direct vision. Throughout the                            procedure, the patient's blood pressure, pulse, and                            oxygen saturations were  monitored continuously. The                            Model PCF-H190DL [id number]) scope was introduced                            through the anus and advanced to the the cecum,                            identified by appendiceal orifice and ileocecal                            valve. The colonoscopy was technically difficult                            and complex due to the patient's oxygen                            desaturation. Successful completion of the                            procedure was aided by performing chin lift,                            administering oxygen, treating with ventilation and                            receiving assistance from additional staff. The                            patient tolerated the procedure well. The quality                            of the bowel preparation was excellent. The                            ileocecal valve, appendiceal orifice, and rectum  were photographed. Scope In: 8:45:46 AM Scope Out: 9:36:40 AM Scope Withdrawal Time: 0 hours 34 minutes 30 seconds  Total Procedure Duration: 0 hours 50 minutes 54 seconds  Findings:                 The perianal and digital rectal examinations were                            normal.                           A 3 mm polyp was found in the cecum. The polyp was                            sessile. The polyp was removed with a cold biopsy                            forceps. Resection and retrieval were complete.                           Non-bleeding internal hemorrhoids were found during                            retroflexion. The hemorrhoids were small. Complications:            No immediate complications. Estimated Blood Loss:     Estimated blood loss was minimal. Impression:               - One 3 mm polyp in the cecum, removed with a cold                            biopsy forceps. Resected and retrieved.                           - Non-bleeding internal  hemorrhoids. Recommendation:           - Patient has a contact number available for                            emergencies. The signs and symptoms of potential                            delayed complications were discussed with the                            patient. Return to normal activities tomorrow.                            Written discharge instructions were provided to the                            patient.                           - Resume previous diet.                           -  Continue present medications.                           - Await pathology results.                           - Repeat colonoscopy in 5 years for surveillance                            based on pathology results. Mauri Pole, MD 03/12/2018 9:46:09 AM This report has been signed electronically.

## 2018-03-13 ENCOUNTER — Telehealth: Payer: Self-pay | Admitting: *Deleted

## 2018-03-13 NOTE — Telephone Encounter (Signed)
Has developed blister under tongue.  Only had a colonoscopy but was told she was bagged with ambu-bag yesterday.  Will forward this note to MD.  She also feels she has a cold.  Advised to call pcp for treatment.

## 2018-03-13 NOTE — Telephone Encounter (Signed)
  Follow up Call-  Call back number 03/12/2018  Post procedure Call Back phone  # [phone]  Permission to leave phone message Yes  Some recent data might be hidden     Patient questions:  Do you have a fever, pain , or abdominal swelling? No. Pain Score  0 *  Have you tolerated food without any problems? Yes.    Have you been able to return to your normal activities? Yes.    Do you have any questions about your discharge instructions: Diet   No. Medications  No. Follow up visit  No.  Do you have questions or concerns about your Care? Yes.   Pr reports blister on her tongue.  Had already spoke with someone in GI office and was told to contact PCP.  Otherwise denies concerns.  Actions: * If pain score is 4 or above: No action needed, pain <4.

## 2018-03-14 DIAGNOSIS — R69 Illness, unspecified: Secondary | ICD-10-CM | POA: Diagnosis not present

## 2018-03-14 DIAGNOSIS — F411 Generalized anxiety disorder: Secondary | ICD-10-CM | POA: Diagnosis not present

## 2018-03-14 NOTE — Telephone Encounter (Signed)
Called patient, did not reach. She had oral tube placed and also had ambu bag. If not getting better, please bring her in to be evaluated.

## 2018-03-14 NOTE — Telephone Encounter (Signed)
Dr. Silverio Decamp,  I left this pt a voicemail encouraging her to return my call if she wished.  Thanks,  Osvaldo Angst

## 2018-03-15 ENCOUNTER — Telehealth: Payer: Self-pay | Admitting: Gastroenterology

## 2018-03-15 NOTE — Telephone Encounter (Signed)
Called patient, discussed colonoscopy findings, await pathology report.  She is doing better overall. Eating well and had formed BM this AM.  She will call with any concerns

## 2018-03-15 NOTE — Telephone Encounter (Signed)
-----   Message from Osvaldo Angst, CRNA sent at 03/14/2018  5:28 PM EST ----- Regarding: Pt with tongue pain Dr. Silverio Decamp,  Just had a pleasant conversation with Ms. Holcomb.  She described a small, sore area under her tongue which had improved since yesterday.  I encouraged her to use saline rinses.  She said she would call me if there was not continued improvement.  Thanks,  Osvaldo Angst

## 2018-03-19 ENCOUNTER — Encounter: Payer: Self-pay | Admitting: Gastroenterology

## 2018-04-10 DIAGNOSIS — R69 Illness, unspecified: Secondary | ICD-10-CM | POA: Diagnosis not present

## 2018-04-10 DIAGNOSIS — F411 Generalized anxiety disorder: Secondary | ICD-10-CM | POA: Diagnosis not present

## 2018-04-18 DIAGNOSIS — R69 Illness, unspecified: Secondary | ICD-10-CM | POA: Diagnosis not present

## 2018-04-18 DIAGNOSIS — F411 Generalized anxiety disorder: Secondary | ICD-10-CM | POA: Diagnosis not present

## 2018-04-25 DIAGNOSIS — R69 Illness, unspecified: Secondary | ICD-10-CM | POA: Diagnosis not present

## 2018-04-25 DIAGNOSIS — F411 Generalized anxiety disorder: Secondary | ICD-10-CM | POA: Diagnosis not present

## 2018-05-01 DIAGNOSIS — F411 Generalized anxiety disorder: Secondary | ICD-10-CM | POA: Diagnosis not present

## 2018-05-01 DIAGNOSIS — R69 Illness, unspecified: Secondary | ICD-10-CM | POA: Diagnosis not present

## 2018-05-09 DIAGNOSIS — R69 Illness, unspecified: Secondary | ICD-10-CM | POA: Diagnosis not present

## 2018-05-09 DIAGNOSIS — K8020 Calculus of gallbladder without cholecystitis without obstruction: Secondary | ICD-10-CM | POA: Diagnosis not present

## 2018-05-09 DIAGNOSIS — F411 Generalized anxiety disorder: Secondary | ICD-10-CM | POA: Diagnosis not present

## 2018-05-16 DIAGNOSIS — F411 Generalized anxiety disorder: Secondary | ICD-10-CM | POA: Diagnosis not present

## 2018-05-16 DIAGNOSIS — R69 Illness, unspecified: Secondary | ICD-10-CM | POA: Diagnosis not present

## 2018-05-23 DIAGNOSIS — R69 Illness, unspecified: Secondary | ICD-10-CM | POA: Diagnosis not present

## 2018-05-23 DIAGNOSIS — F411 Generalized anxiety disorder: Secondary | ICD-10-CM | POA: Diagnosis not present

## 2018-06-05 ENCOUNTER — Ambulatory Visit (HOSPITAL_COMMUNITY): Payer: Medicare HMO

## 2018-06-28 DIAGNOSIS — E039 Hypothyroidism, unspecified: Secondary | ICD-10-CM | POA: Diagnosis not present

## 2018-06-28 DIAGNOSIS — R69 Illness, unspecified: Secondary | ICD-10-CM | POA: Diagnosis not present

## 2018-06-28 DIAGNOSIS — R030 Elevated blood-pressure reading, without diagnosis of hypertension: Secondary | ICD-10-CM | POA: Diagnosis not present

## 2018-06-28 DIAGNOSIS — E782 Mixed hyperlipidemia: Secondary | ICD-10-CM | POA: Diagnosis not present

## 2018-07-25 ENCOUNTER — Other Ambulatory Visit: Payer: Self-pay

## 2018-07-25 ENCOUNTER — Other Ambulatory Visit: Payer: Self-pay | Admitting: Podiatry

## 2018-07-25 ENCOUNTER — Ambulatory Visit (INDEPENDENT_AMBULATORY_CARE_PROVIDER_SITE_OTHER): Payer: Medicare HMO

## 2018-07-25 ENCOUNTER — Ambulatory Visit: Payer: Medicare HMO | Admitting: Podiatry

## 2018-07-25 DIAGNOSIS — L989 Disorder of the skin and subcutaneous tissue, unspecified: Secondary | ICD-10-CM

## 2018-07-25 DIAGNOSIS — M79672 Pain in left foot: Secondary | ICD-10-CM

## 2018-07-25 DIAGNOSIS — M7751 Other enthesopathy of right foot: Secondary | ICD-10-CM

## 2018-07-25 DIAGNOSIS — M7672 Peroneal tendinitis, left leg: Secondary | ICD-10-CM

## 2018-07-25 DIAGNOSIS — M779 Enthesopathy, unspecified: Secondary | ICD-10-CM

## 2018-07-25 DIAGNOSIS — M79671 Pain in right foot: Secondary | ICD-10-CM

## 2018-07-25 NOTE — Progress Notes (Signed)
DG  °

## 2018-07-25 NOTE — Progress Notes (Signed)
   Subjective: Patient presents to the office today for chief complaint of painful callus lesions of the feet. Patient states that the pain is ongoing and is affecting their ability to ambulate without pain.  Patient also states that she has some burning pain to the lateral aspect of the left foot.  Pain is been ongoing off and on for several years.  She denies injury.  She is received injections in this area in the past which have helped significantly.  Patient presents today for further treatment and evaluation.  Past Medical History:  Diagnosis Date  . Allergy   . Arthritis   . Asthma   . Cancer ([place])    skin  . Depression   . Thyroid disease      Objective:  Physical Exam General: Alert and oriented x3 in no acute distress  Dermatology: Hyperkeratotic lesion present on the plantar aspect of the bilateral forefoot x5. Pain on palpation with a central nucleated core noted. Skin is warm, dry and supple bilateral lower extremities. Negative for open lesions or macerations.  Vascular: Palpable pedal pulses bilaterally. No edema or erythema noted. Capillary refill within normal limits.  Neurological: Epicritic and protective threshold grossly intact bilaterally.   Musculoskeletal Exam: Pain on palpation at the keratotic lesion noted. Range of motion within normal limits bilateral. Muscle strength 5/5 in all groups bilateral.  There is some pain on palpation to the fifth metatarsal tubercle at the insertion of the peroneal tendons left foot.  Assessment: 1.  Porokeratosis bilateral forefoot x5 2.  Insertional peroneal tendinitis left   Plan of Care:  1. Patient evaluated 2. Excisional debridement of keratoic lesion using a chisel blade was performed without incident.  3. Dressed area with salicylic acid and light dressing. 4.  Injection of 0.5 cc Celestone Soluspan injected into the peroneal tendon sheath left foot at the insertion of the fifth metatarsal tubercle 5.  Recommend  OTC corn and callus remover 6.  Patient is to return to the clinic PRN.   Edrick Kins, DPM [facility]  Dr. Edrick Kins, [address]                                        [place], [place] [address]                Office [phone]  Fax [phone]

## 2018-09-12 DIAGNOSIS — Z20828 Contact with and (suspected) exposure to other viral communicable diseases: Secondary | ICD-10-CM | POA: Diagnosis not present

## 2018-12-20 DIAGNOSIS — R69 Illness, unspecified: Secondary | ICD-10-CM | POA: Diagnosis not present

## 2019-01-09 DIAGNOSIS — E782 Mixed hyperlipidemia: Secondary | ICD-10-CM | POA: Diagnosis not present

## 2019-01-09 DIAGNOSIS — E039 Hypothyroidism, unspecified: Secondary | ICD-10-CM | POA: Diagnosis not present

## 2019-01-11 DIAGNOSIS — Z7189 Other specified counseling: Secondary | ICD-10-CM | POA: Diagnosis not present

## 2019-01-11 DIAGNOSIS — D692 Other nonthrombocytopenic purpura: Secondary | ICD-10-CM | POA: Diagnosis not present

## 2019-01-11 DIAGNOSIS — E782 Mixed hyperlipidemia: Secondary | ICD-10-CM | POA: Diagnosis not present

## 2019-01-11 DIAGNOSIS — M899 Disorder of bone, unspecified: Secondary | ICD-10-CM | POA: Diagnosis not present

## 2019-01-11 DIAGNOSIS — M722 Plantar fascial fibromatosis: Secondary | ICD-10-CM | POA: Diagnosis not present

## 2019-01-11 DIAGNOSIS — E039 Hypothyroidism, unspecified: Secondary | ICD-10-CM | POA: Diagnosis not present

## 2019-01-11 DIAGNOSIS — R69 Illness, unspecified: Secondary | ICD-10-CM | POA: Diagnosis not present

## 2019-01-11 DIAGNOSIS — G43909 Migraine, unspecified, not intractable, without status migrainosus: Secondary | ICD-10-CM | POA: Diagnosis not present

## 2019-01-11 DIAGNOSIS — Z0000 Encounter for general adult medical examination without abnormal findings: Secondary | ICD-10-CM | POA: Diagnosis not present

## 2019-01-23 DIAGNOSIS — Z1231 Encounter for screening mammogram for malignant neoplasm of breast: Secondary | ICD-10-CM | POA: Diagnosis not present

## 2019-02-28 DIAGNOSIS — Z85828 Personal history of other malignant neoplasm of skin: Secondary | ICD-10-CM | POA: Diagnosis not present

## 2019-02-28 DIAGNOSIS — L820 Inflamed seborrheic keratosis: Secondary | ICD-10-CM | POA: Diagnosis not present

## 2019-02-28 DIAGNOSIS — D2272 Melanocytic nevi of left lower limb, including hip: Secondary | ICD-10-CM | POA: Diagnosis not present

## 2019-02-28 DIAGNOSIS — L814 Other melanin hyperpigmentation: Secondary | ICD-10-CM | POA: Diagnosis not present

## 2019-02-28 DIAGNOSIS — L821 Other seborrheic keratosis: Secondary | ICD-10-CM | POA: Diagnosis not present

## 2019-02-28 DIAGNOSIS — L570 Actinic keratosis: Secondary | ICD-10-CM | POA: Diagnosis not present

## 2019-02-28 DIAGNOSIS — D1801 Hemangioma of skin and subcutaneous tissue: Secondary | ICD-10-CM | POA: Diagnosis not present

## 2019-02-28 DIAGNOSIS — D225 Melanocytic nevi of trunk: Secondary | ICD-10-CM | POA: Diagnosis not present
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
## 2019-04-17 ENCOUNTER — Ambulatory Visit: Payer: Medicare Other | Attending: Internal Medicine

## 2019-04-17 DIAGNOSIS — Z23 Encounter for immunization: Secondary | ICD-10-CM

## 2019-04-17 NOTE — Progress Notes (Signed)
   Covid-19 Vaccination Clinic  Name:  Sheri Ray    MRN: [id number] DOB: [birth year]  04/17/2019  [name] was observed post Covid-19 immunization for 15 minutes without incidence. She was provided with Vaccine Information Sheet and instruction to access the V-Safe system.   [name] was instructed to call 911 with any severe reactions post vaccine: Marland Kitchen Difficulty breathing  . Swelling of your face and throat  . A fast heartbeat  . A bad rash all over your body  . Dizziness and weakness    Immunizations Administered    Name Date Dose VIS Date Route   Pfizer COVID-19 Vaccine 04/17/2019  6:57 PM 0.3 mL 03/08/2019 Intramuscular   Manufacturer: [place]   Lot: [id number]   [place]: [id number]

## 2019-05-08 ENCOUNTER — Ambulatory Visit: Payer: Medicare HMO | Attending: Internal Medicine

## 2019-05-08 ENCOUNTER — Ambulatory Visit: Payer: Medicare HMO

## 2019-05-08 DIAGNOSIS — Z23 Encounter for immunization: Secondary | ICD-10-CM | POA: Insufficient documentation

## 2019-05-08 NOTE — Progress Notes (Signed)
   Covid-19 Vaccination Clinic  Name:  Sheri Ray    MRN: [id number] DOB: 10/09/1945  05/08/2019  [name] was observed post Covid-19 immunization for 15 minutes without incidence. She was provided with Vaccine Information Sheet and instruction to access the V-Safe system.   [name] was instructed to call 911 with any severe reactions post vaccine: Marland Kitchen Difficulty breathing  . Swelling of your face and throat  . A fast heartbeat  . A bad rash all over your body  . Dizziness and weakness    Immunizations Administered    Name Date Dose VIS Date Route   Pfizer COVID-19 Vaccine 05/08/2019 11:57 AM 0.3 mL 03/08/2019 Intramuscular   Manufacturer: [place]   Lot: [id number]   [place]: [id number]

## 2019-05-10 DIAGNOSIS — H2513 Age-related nuclear cataract, bilateral: Secondary | ICD-10-CM | POA: Diagnosis not present

## 2019-07-15 ENCOUNTER — Ambulatory Visit: Payer: Medicare HMO | Admitting: Podiatrist

## 2019-07-15 ENCOUNTER — Encounter: Payer: Self-pay | Admitting: Podiatrist

## 2019-07-15 ENCOUNTER — Other Ambulatory Visit: Payer: Self-pay

## 2019-07-15 VITALS — Temp 96.9°F

## 2019-07-15 DIAGNOSIS — L851 Acquired keratosis [keratoderma] palmaris et plantaris: Secondary | ICD-10-CM

## 2019-07-15 DIAGNOSIS — M216X9 Other acquired deformities of unspecified foot: Secondary | ICD-10-CM

## 2019-07-15 NOTE — Progress Notes (Signed)
  Chief Complaint  Patient presents with  . Callouses    "BL reaccurring painful corns/callouses ball of foot; L is worse"     HPI: Patient is 74 y.o. female who presents today for the concerns as listed above. She used to see Dr. Amalia Hailey who was able to debride her lesions for her with 6 months or so of relief   Review of Systems No fevers, chills, nausea, muscle aches, no difficulty breathing, no calf pain, no chest pain or shortness of breath.   Physical Exam  GENERAL APPEARANCE: Alert, conversant. Appropriately groomed. No acute distress.   VASCULAR: Pedal pulses palpable DP and PT bilateral.  Capillary refill time is immediate to all digits,  Proximal to distal cooling it warm to warm.  Digital hair growth is present bilateral   NEUROLOGIC: sensation is intact epicritically and protectively to 5.07 monofilament at 5/5 sites bilateral.  Light touch is intact bilateral, vibratory sensation intact bilateral, achilles tendon reflex is intact bilateral.   MUSCULOSKELETAL: prominent metatarsal heads noted bilateral   DERMATOLOGIC: skin is warm, supple, and dry. Porokeratotic lesion present submet 2/3 bilateral.  Intractable keratotic lesion with pain with direct pressure and medial to lateral compression noted.   Assessment    ICD-10-CM   1. Acquired plantar porokeratosis  L85.1   2. Prominent metatarsal head, unspecified laterality  M21.6X9      Plan  Debrided the porokeratotic lesions with a 15 blade and packed with salinocaine and covered with tape.  Also added a pad to the distal portion of her otc inserts with hopes in offloading the prominent metatarsal heads.  She will return as needed.

## 2019-08-16 DIAGNOSIS — E782 Mixed hyperlipidemia: Secondary | ICD-10-CM | POA: Diagnosis not present

## 2019-08-16 DIAGNOSIS — E039 Hypothyroidism, unspecified: Secondary | ICD-10-CM | POA: Diagnosis not present

## 2019-08-16 DIAGNOSIS — R69 Illness, unspecified: Secondary | ICD-10-CM | POA: Diagnosis not present

## 2019-08-27 ENCOUNTER — Other Ambulatory Visit: Payer: Self-pay | Admitting: Podiatry

## 2019-09-04 ENCOUNTER — Telehealth: Payer: Self-pay | Admitting: Podiatry

## 2019-09-04 NOTE — Telephone Encounter (Signed)
Pt has called a few times and would like a new script for voltaran she stated that her other refills are out of date and was not able to get the medication again. Please advise

## 2019-09-05 ENCOUNTER — Other Ambulatory Visit: Payer: Self-pay | Admitting: Podiatry

## 2019-09-05 MED ORDER — DICLOFENAC SODIUM 50 MG PO TBEC: 50.0000 mg | DELAYED_RELEASE_TABLET | Freq: Two times a day (BID) | ORAL | 0 refills | Status: DC

## 2019-09-05 NOTE — Progress Notes (Signed)
PRN pain 

## 2019-09-05 NOTE — Telephone Encounter (Signed)
Rx sent. - Dr. Kharon Hixon

## 2019-09-06 ENCOUNTER — Telehealth: Payer: Self-pay | Admitting: Podiatry

## 2019-09-06 NOTE — Telephone Encounter (Signed)
Medication that was sent for voltaren was discontinued by pharmacy needs new orders for medication please assist

## 2019-09-27 ENCOUNTER — Other Ambulatory Visit: Payer: Self-pay | Admitting: Podiatry

## 2019-10-29 DIAGNOSIS — M542 Cervicalgia: Secondary | ICD-10-CM | POA: Diagnosis not present

## 2019-11-17 DIAGNOSIS — Z03818 Encounter for observation for suspected exposure to other biological agents ruled out: Secondary | ICD-10-CM | POA: Diagnosis not present

## 2019-11-17 DIAGNOSIS — J22 Unspecified acute lower respiratory infection: Secondary | ICD-10-CM | POA: Diagnosis not present

## 2019-12-06 DIAGNOSIS — Z23 Encounter for immunization: Secondary | ICD-10-CM | POA: Diagnosis not present

## 2019-12-06 DIAGNOSIS — M722 Plantar fascial fibromatosis: Secondary | ICD-10-CM | POA: Diagnosis not present

## 2019-12-13 ENCOUNTER — Other Ambulatory Visit: Payer: Self-pay

## 2019-12-13 ENCOUNTER — Encounter: Payer: Self-pay | Admitting: Podiatry

## 2019-12-13 ENCOUNTER — Ambulatory Visit: Payer: Medicare HMO | Admitting: Podiatry

## 2019-12-13 DIAGNOSIS — M79672 Pain in left foot: Secondary | ICD-10-CM

## 2019-12-13 DIAGNOSIS — M79671 Pain in right foot: Secondary | ICD-10-CM

## 2019-12-13 DIAGNOSIS — L851 Acquired keratosis [keratoderma] palmaris et plantaris: Secondary | ICD-10-CM

## 2019-12-15 NOTE — Progress Notes (Signed)
Subjective:  Patient ID: Sheri Ray, female    DOB: 20-Sep-1945,  MRN: [id number]  Sheri Ray presents to clinic today for painful porokeratotic lesions b/l feet.  Pain prevent comfortable ambulation. Aggravating factor is weightbearing with or without shoegear.   She has brought in several OTC products for plantar fasciitis which is being treated by her PCP.   Review of Systems: Negative except as noted in the HPI. Past Medical History:  Diagnosis Date  . Allergy   . Arthritis   . Asthma   . Cancer ([place])    skin  . Depression   . Thyroid disease    Past Surgical History:  Procedure Laterality Date  . ANKLE FRACTURE SURGERY    . COLONOSCOPY  01/20/2015  . FINGER SURGERY    . HAND SURGERY     right hand  . TONSILLECTOMY      Current Outpatient Medications:  .  acetaminophen (TYLENOL) 500 MG tablet, Take 500 mg by mouth every 6 (six) hours as needed., Disp: , Rfl:  .  caffeine 200 MG TABS tablet, Take 200 mg by mouth 2 (two) times daily., Disp: , Rfl:  .  Calcium-Vitamin D 600-200 MG-UNIT per tablet, Take 1 tablet by mouth 2 (two) times daily., Disp: , Rfl:  .  citalopram (CELEXA) 20 MG tablet, Take 20 mg by mouth daily., Disp: , Rfl:  .  cyclobenzaprine (FLEXERIL) 5 MG tablet, Take 5 mg by mouth 3 (three) times daily as needed., Disp: , Rfl:  .  diclofenac (VOLTAREN) 50 MG EC tablet, TAKE 1 TABLET BY MOUTH TWICE A DAY, Disp: 60 tablet, Rfl: 0 .  GARLIC PO, Take 1 tablet by mouth daily., Disp: , Rfl:  .  ibuprofen (ADVIL,MOTRIN) 200 MG tablet, Take 200 mg by mouth every 6 (six) hours as needed., Disp: , Rfl:  .  Lactobacillus (ACIDOPHILUS) 0.5 MG TABS, Take by mouth., Disp: , Rfl:  .  loratadine (CLARITIN) 10 MG tablet, Take 10 mg by mouth daily., Disp: , Rfl:  .  Magnesium 250 MG TABS, Take by mouth., Disp: , Rfl:  .  Omega-3 Fatty Acids (FISH OIL PO), Take 1 capsule by mouth daily., Disp: , Rfl:  .  SUMAtriptan (IMITREX) 100 MG tablet, TAKE 1 TABLET BY MOUTH AS  NEEDED FOR HEADACHE, Disp: , Rfl: 0 .  thyroid (ARMOUR THYROID) 60 MG tablet, Take 30-60 mg by mouth See admin instructions. TAKES 30MG  ONLY ON MON'S  TAKES 60MG  ALL OTHER DAYS, Disp: , Rfl:  .  Turmeric Curcumin 500 MG CAPS, Take by mouth., Disp: , Rfl:  .  zinc gluconate 50 MG tablet, Take 50 mg by mouth daily., Disp: , Rfl:  Allergies  Allergen Reactions  . Latex Itching    Burning, itching, pain  . Pineapple Itching   Social History   Occupational History  . Not on file  Tobacco Use  . Smoking status: Never Smoker  . Smokeless tobacco: Never Used  Vaping Use  . Vaping Use: Never used  Substance and Sexual Activity  . Alcohol use: Yes    Alcohol/week: 0.0 standard drinks    Comment: occ. wine per pt  . Drug use: No  . Sexual activity: Not on file    Objective:   Constitutional Sheri Ray is a pleasant 74 y.o. Caucasian female, WD, WN in NAD.Marland Kitchen AAO x 3.   Vascular Capillary refill time to digits immediate b/l. Palpable pedal pulses b/l LE. Pedal hair present. Lower extremity skin temperature gradient within normal  limits. No pain with calf compression b/l. No edema noted b/l lower extremities.  No cyanosis or clubbing noted.  Neurologic Normal speech. Oriented to person, place, and time. Epicritic sensation to light touch grossly present bilaterally. Protective sensation intact 5/5 intact bilaterally with 10g monofilament b/l. Vibratory sensation intact b/l. Proprioception intact bilaterally.  Dermatologic Pedal skin with normal turgor, texture and tone bilaterally. No open wounds bilaterally. No interdigital macerations bilaterally. Toenails 1-5 b/l well maintained with adequate length. No erythema, no edema, no drainage, no flocculence. Porokeratotic lesion(s) submet head 2 left foot, submet head 2 right foot, submet head 3 left foot and submet head 3 right foot. No erythema, no edema, no drainage, no flocculence.  Orthopedic: Normal muscle strength 5/5 to all lower  extremity muscle groups bilaterally. No pain crepitus or joint limitation noted with ROM b/l. Plantarflexed metatarsal(s) b/l lower extremities. She has one pair of Dr. Felicie Morn OTC arch supports and b/l heel gel cushions.   Radiographs: None Assessment:   1. Acquired plantar porokeratosis   2. Pain in both feet    Plan:  Patient was evaluated and treated and all questions answered. -Examined patient. Advised she may use the Dr. Felicie Morn arch supports and gel heel cushions. Also discussed tennis ball rolls under arch when she is sitting down. -Painful porokeratotic lesion(s) submet head 2 left foot, submet head 2 right foot, submet head 3 left foot and submet head 3 right foot pared and enucleated with sterile scalpel blade without incident. -Patient to report any pedal injuries to medical professional immediately. -Patient to continue soft, supportive shoe gear daily. -Patient/POA to call should there be question/concern in the interim.  Return if symptoms worsen or fail to improve.  Marzetta Board, DPM

## 2020-01-11 DIAGNOSIS — Z20822 Contact with and (suspected) exposure to covid-19: Secondary | ICD-10-CM | POA: Diagnosis not present

## 2020-01-30 DIAGNOSIS — H2513 Age-related nuclear cataract, bilateral: Secondary | ICD-10-CM | POA: Diagnosis not present

## 2020-01-30 DIAGNOSIS — Z1231 Encounter for screening mammogram for malignant neoplasm of breast: Secondary | ICD-10-CM | POA: Diagnosis not present

## 2020-03-02 DIAGNOSIS — L821 Other seborrheic keratosis: Secondary | ICD-10-CM | POA: Diagnosis not present

## 2020-03-02 DIAGNOSIS — D2261 Melanocytic nevi of right upper limb, including shoulder: Secondary | ICD-10-CM | POA: Diagnosis not present

## 2020-03-02 DIAGNOSIS — D2371 Other benign neoplasm of skin of right lower limb, including hip: Secondary | ICD-10-CM | POA: Diagnosis not present

## 2020-03-02 DIAGNOSIS — Z85828 Personal history of other malignant neoplasm of skin: Secondary | ICD-10-CM | POA: Diagnosis not present

## 2020-03-02 DIAGNOSIS — L570 Actinic keratosis: Secondary | ICD-10-CM | POA: Diagnosis not present

## 2020-03-02 DIAGNOSIS — L814 Other melanin hyperpigmentation: Secondary | ICD-10-CM | POA: Diagnosis not present

## 2020-03-02 DIAGNOSIS — D2262 Melanocytic nevi of left upper limb, including shoulder: Secondary | ICD-10-CM | POA: Diagnosis not present

## 2020-03-02 DIAGNOSIS — D225 Melanocytic nevi of trunk: Secondary | ICD-10-CM | POA: Diagnosis not present

## 2020-03-05 DIAGNOSIS — R69 Illness, unspecified: Secondary | ICD-10-CM | POA: Diagnosis not present

## 2020-03-06 DIAGNOSIS — M899 Disorder of bone, unspecified: Secondary | ICD-10-CM | POA: Diagnosis not present

## 2020-03-06 DIAGNOSIS — G43909 Migraine, unspecified, not intractable, without status migrainosus: Secondary | ICD-10-CM | POA: Diagnosis not present

## 2020-03-06 DIAGNOSIS — D692 Other nonthrombocytopenic purpura: Secondary | ICD-10-CM | POA: Diagnosis not present

## 2020-03-06 DIAGNOSIS — Z0000 Encounter for general adult medical examination without abnormal findings: Secondary | ICD-10-CM | POA: Diagnosis not present

## 2020-03-06 DIAGNOSIS — E039 Hypothyroidism, unspecified: Secondary | ICD-10-CM | POA: Diagnosis not present

## 2020-03-06 DIAGNOSIS — E782 Mixed hyperlipidemia: Secondary | ICD-10-CM | POA: Diagnosis not present

## 2020-03-06 DIAGNOSIS — R69 Illness, unspecified: Secondary | ICD-10-CM | POA: Diagnosis not present

## 2020-03-19 DIAGNOSIS — H25813 Combined forms of age-related cataract, bilateral: Secondary | ICD-10-CM | POA: Diagnosis not present

## 2020-05-15 DIAGNOSIS — H52222 Regular astigmatism, left eye: Secondary | ICD-10-CM | POA: Diagnosis not present

## 2020-05-15 DIAGNOSIS — H25812 Combined forms of age-related cataract, left eye: Secondary | ICD-10-CM | POA: Diagnosis not present

## 2020-05-15 DIAGNOSIS — H2512 Age-related nuclear cataract, left eye: Secondary | ICD-10-CM | POA: Diagnosis not present

## 2020-05-21 ENCOUNTER — Other Ambulatory Visit: Payer: Self-pay

## 2020-05-21 ENCOUNTER — Ambulatory Visit: Payer: Medicare HMO | Admitting: Podiatry

## 2020-05-21 ENCOUNTER — Encounter: Payer: Self-pay | Admitting: Podiatry

## 2020-05-21 DIAGNOSIS — Q828 Other specified congenital malformations of skin: Secondary | ICD-10-CM

## 2020-05-21 NOTE — Progress Notes (Signed)
Subjective:   Patient ID: Sheri Ray, female   DOB: 75 y.o.   MRN: [id number]   HPI Patient presents chronic lesion formation plantar aspect both feet   ROS      Objective:  Physical Exam  Neurovascular status intact chronic keratotic lesion noted bilateral feet     Assessment:  Probability for porokeratotic lesions bilateral     Plan:  H&P debridement accomplished no iatrogenic bleeding reappoint routine care as needed

## 2020-06-03 DIAGNOSIS — H2511 Age-related nuclear cataract, right eye: Secondary | ICD-10-CM | POA: Diagnosis not present

## 2020-06-05 DIAGNOSIS — H2511 Age-related nuclear cataract, right eye: Secondary | ICD-10-CM | POA: Diagnosis not present

## 2020-06-05 DIAGNOSIS — H25811 Combined forms of age-related cataract, right eye: Secondary | ICD-10-CM | POA: Diagnosis not present

## 2020-06-07 DIAGNOSIS — Z20822 Contact with and (suspected) exposure to covid-19: Secondary | ICD-10-CM | POA: Diagnosis not present

## 2020-06-27 DIAGNOSIS — M19071 Primary osteoarthritis, right ankle and foot: Secondary | ICD-10-CM | POA: Diagnosis not present

## 2020-06-27 DIAGNOSIS — M25471 Effusion, right ankle: Secondary | ICD-10-CM | POA: Diagnosis not present

## 2020-06-27 DIAGNOSIS — M25571 Pain in right ankle and joints of right foot: Secondary | ICD-10-CM | POA: Diagnosis not present

## 2020-06-30 DIAGNOSIS — M7751 Other enthesopathy of right foot: Secondary | ICD-10-CM | POA: Diagnosis not present

## 2020-07-01 DIAGNOSIS — Z0100 Encounter for examination of eyes and vision without abnormal findings: Secondary | ICD-10-CM | POA: Diagnosis not present

## 2020-07-10 DIAGNOSIS — Z780 Asymptomatic menopausal state: Secondary | ICD-10-CM | POA: Diagnosis not present

## 2020-07-10 DIAGNOSIS — M8589 Other specified disorders of bone density and structure, multiple sites: Secondary | ICD-10-CM | POA: Diagnosis not present

## 2020-07-28 ENCOUNTER — Ambulatory Visit: Payer: Medicare HMO | Admitting: Podiatry

## 2020-07-28 ENCOUNTER — Other Ambulatory Visit: Payer: Self-pay

## 2020-07-28 ENCOUNTER — Encounter: Payer: Self-pay | Admitting: Podiatry

## 2020-07-28 DIAGNOSIS — M216X9 Other acquired deformities of unspecified foot: Secondary | ICD-10-CM | POA: Diagnosis not present

## 2020-07-28 DIAGNOSIS — Q828 Other specified congenital malformations of skin: Secondary | ICD-10-CM | POA: Diagnosis not present

## 2020-07-28 DIAGNOSIS — M79672 Pain in left foot: Secondary | ICD-10-CM

## 2020-07-28 DIAGNOSIS — M79671 Pain in right foot: Secondary | ICD-10-CM

## 2020-08-03 DIAGNOSIS — Z85828 Personal history of other malignant neoplasm of skin: Secondary | ICD-10-CM | POA: Diagnosis not present

## 2020-08-03 DIAGNOSIS — D225 Melanocytic nevi of trunk: Secondary | ICD-10-CM | POA: Diagnosis not present

## 2020-08-03 DIAGNOSIS — L814 Other melanin hyperpigmentation: Secondary | ICD-10-CM | POA: Diagnosis not present

## 2020-08-03 DIAGNOSIS — L570 Actinic keratosis: Secondary | ICD-10-CM | POA: Diagnosis not present

## 2020-08-03 DIAGNOSIS — D1801 Hemangioma of skin and subcutaneous tissue: Secondary | ICD-10-CM | POA: Diagnosis not present

## 2020-08-03 DIAGNOSIS — L821 Other seborrheic keratosis: Secondary | ICD-10-CM | POA: Diagnosis not present

## 2020-08-03 NOTE — Progress Notes (Signed)
Subjective: Sheri Ray is a pleasant 75 y.o. female patient seen today for painful plantar lesions of both feet. Pain interferes with ambulation. Aggravating factors include weightbearing with and without shoe gear.  Pain is relieved with periodic professional debridement.  Allergies  Allergen Reactions  . Latex Itching    Burning, itching, pain  . Other     Other reaction(s): abd pain and irritation  . Pineapple Itching    Objective: Physical Exam  General: Sheri Ray is a pleasant 75 y.o. Caucasian female, WD, WN in NAD. AAO x 3.   Vascular:  Capillary refill time to digits immediate b/l. Palpable pedal pulses b/l LE. Pedal hair present b/l lower extremities. Lower extremity skin temperature gradient within normal limits. No pain with calf compression b/l. No edema noted b/l lower extremities.   Dermatological:  Pedal skin with normal turgor, texture and tone bilaterally. No open wounds bilaterally. No interdigital macerations bilaterally. No hyperkeratotic nor porokeratotic lesions present on today's visit.  Musculoskeletal:  Normal muscle strength 5/5 to all lower extremity muscle groups bilaterally. No pain crepitus or joint limitation noted with ROM b/l. Plantarflexed metatarsal(s) 2nd metatarsal head b/l lower extremities and 3rd metatarsal head b/l lower extremities.  Neurological:  Protective sensation intact 5/5 intact bilaterally with 10g monofilament b/l. Vibratory sensation intact b/l. Clonus negative b/l.  Assessment and Plan:  1. Porokeratosis   2. Pain in both feet   3. Prominent metatarsal head, unspecified laterality      -Examined patient. -Patient to continue soft, supportive shoe gear daily. -Painful porokeratotic lesion(s) submet head 2 left foot, submet head 2 right foot, submet head 3 left foot and submet head 3 right foot pared and enucleated with sterile scalpel blade without incident. -Patient to report any pedal injuries to medical  professional immediately. -Patient/POA to call should there be question/concern in the interim.  Return in about 3 months (around 10/28/2020).  Marzetta Board, DPM

## 2020-08-16 ENCOUNTER — Other Ambulatory Visit: Payer: Self-pay | Admitting: Podiatry

## 2020-09-18 DIAGNOSIS — R69 Illness, unspecified: Secondary | ICD-10-CM | POA: Diagnosis not present

## 2020-09-18 DIAGNOSIS — E039 Hypothyroidism, unspecified: Secondary | ICD-10-CM | POA: Diagnosis not present

## 2020-09-18 DIAGNOSIS — E782 Mixed hyperlipidemia: Secondary | ICD-10-CM | POA: Diagnosis not present

## 2020-10-27 ENCOUNTER — Ambulatory Visit: Payer: Medicare HMO | Admitting: Podiatry

## 2020-11-10 ENCOUNTER — Encounter: Payer: Self-pay | Admitting: Podiatry

## 2020-11-10 ENCOUNTER — Other Ambulatory Visit: Payer: Self-pay

## 2020-11-10 ENCOUNTER — Ambulatory Visit: Payer: Medicare HMO | Admitting: Podiatry

## 2020-11-10 DIAGNOSIS — Q828 Other specified congenital malformations of skin: Secondary | ICD-10-CM | POA: Diagnosis not present

## 2020-11-10 DIAGNOSIS — M79671 Pain in right foot: Secondary | ICD-10-CM | POA: Diagnosis not present

## 2020-11-10 DIAGNOSIS — M79672 Pain in left foot: Secondary | ICD-10-CM | POA: Diagnosis not present

## 2020-11-10 NOTE — Progress Notes (Signed)
Subjective: Sheri Ray is a pleasant 75 y.o. female patient seen today for painful porokeratoses plantar aspect of both feet. Pain interferes with ambulation. Aggravating factors include weightbearing with and without shoe gear. Pain is relieved with periodic professional debridement.  She states her feet feel better and she may like to space out her appointments an additional month.  PCP is Mayra Neer, MD. Last visit was: 09/18/2020.  Allergies  Allergen Reactions   Latex Itching    Burning, itching, pain   Other     Other reaction(s): abd pain and irritation   Pineapple Itching    Objective: Physical Exam  General: Sheri Ray is a pleasant 75 y.o. Caucasian female, WD, WN in NAD. AAO x 3.   Vascular:  Capillary refill time to digits immediate b/l. Palpable DP pulse(s) b/l lower extremities Palpable PT pulse(s) b/l lower extremities Pedal hair present. Lower extremity skin temperature gradient within normal limits. No pain with calf compression b/l. No edema noted b/l lower extremities.  Dermatological:  Skin warm and supple b/l lower extremities. No open wounds b/l lower extremities. No interdigital macerations b/l lower extremities. Toenails b/l lower extremities well maintained with adequate length. No erythema, no edema, no drainage, no fluctuance. Porokeratotic lesion(s) submet head 2 left foot, submet head 2 right foot, submet head 3 left foot, and submet head 3 right foot. No erythema, no edema, no drainage, no fluctuance.  Musculoskeletal:  Normal muscle strength 5/5 to all lower extremity muscle groups bilaterally. Plantarflexed metatarsal(s) 2nd metatarsal head b/l feet and 3rd metatarsal head b/l feet. Patient ambulates independent of any assistive aids.  Neurological:  Protective sensation intact 5/5 intact bilaterally with 10g monofilament b/l. Vibratory sensation intact b/l.  Assessment and Plan:  1. Porokeratosis   2. Pain in both feet       -Examined patient. -Medicare ABN signed for this year. Patient consents for services of paring of porokeratoses b/l feet  today. Copy has been placed in patient's chart. -Patient to continue soft, supportive shoe gear daily. -Patient to report any pedal injuries to medical professional immediately. -Patient/POA to call should there be question/concern in the interim.  Return in about 4 months (around 03/12/2021).  Marzetta Board, DPM

## 2020-11-26 DIAGNOSIS — D225 Melanocytic nevi of trunk: Secondary | ICD-10-CM | POA: Diagnosis not present

## 2020-11-26 DIAGNOSIS — D2372 Other benign neoplasm of skin of left lower limb, including hip: Secondary | ICD-10-CM | POA: Diagnosis not present

## 2020-11-26 DIAGNOSIS — D1801 Hemangioma of skin and subcutaneous tissue: Secondary | ICD-10-CM | POA: Diagnosis not present

## 2020-11-26 DIAGNOSIS — L821 Other seborrheic keratosis: Secondary | ICD-10-CM | POA: Diagnosis not present

## 2020-11-26 DIAGNOSIS — L814 Other melanin hyperpigmentation: Secondary | ICD-10-CM | POA: Diagnosis not present

## 2020-11-26 DIAGNOSIS — Z85828 Personal history of other malignant neoplasm of skin: Secondary | ICD-10-CM | POA: Diagnosis not present

## 2021-02-09 ENCOUNTER — Encounter: Payer: Self-pay | Admitting: Podiatry

## 2021-02-09 ENCOUNTER — Ambulatory Visit: Payer: Medicare HMO | Admitting: Podiatry

## 2021-02-09 ENCOUNTER — Other Ambulatory Visit: Payer: Self-pay

## 2021-02-09 DIAGNOSIS — M79671 Pain in right foot: Secondary | ICD-10-CM | POA: Diagnosis not present

## 2021-02-09 DIAGNOSIS — M79672 Pain in left foot: Secondary | ICD-10-CM | POA: Diagnosis not present

## 2021-02-09 DIAGNOSIS — Q828 Other specified congenital malformations of skin: Secondary | ICD-10-CM | POA: Diagnosis not present

## 2021-02-13 DIAGNOSIS — D692 Other nonthrombocytopenic purpura: Secondary | ICD-10-CM | POA: Insufficient documentation

## 2021-02-13 DIAGNOSIS — N950 Postmenopausal bleeding: Secondary | ICD-10-CM | POA: Insufficient documentation

## 2021-02-13 DIAGNOSIS — M8580 Other specified disorders of bone density and structure, unspecified site: Secondary | ICD-10-CM | POA: Insufficient documentation

## 2021-02-13 DIAGNOSIS — R202 Paresthesia of skin: Secondary | ICD-10-CM | POA: Insufficient documentation

## 2021-02-13 NOTE — Progress Notes (Signed)
  Subjective:  Patient ID: Sheri Ray, female    DOB: [birth year],  MRN: [id number]  Sheri Ray presents to clinic today for painful porokeratotic lesions of both feet.  Pain prevent comfortable ambulation. Aggravating factor is weightbearing with or without shoegear. It is relieved with periodic professional debridement.  PCP is Mayra Neer, MD , and last visit was 09/18/2020.  Allergies  Allergen Reactions   Latex Itching    Burning, itching, pain   Other     Other reaction(s): abd pain and irritation   Pineapple Itching    Review of Systems: Negative except as noted in the HPI. Objective:   Constitutional Sheri Ray is a pleasant 75 y.o. Caucasian female, WD, WN in NAD. AAO x 3.   Vascular CFT <3 seconds b/l LE. Palpable DP/PT pulses b/l LE. Digital hair present b/l. Skin temperature gradient WNL b/l. No pain with calf compression b/l. No edema noted b/l. No cyanosis or clubbing noted b/l LE. No cyanosis or clubbing noted.  Neurologic Normal speech. Oriented to person, place, and time. Protective sensation intact 5/5 intact bilaterally with 10g monofilament b/l. Vibratory sensation intact b/l. Proprioception intact bilaterally.  Dermatologic Pedal integument with normal turgor, texture and tone BLE. No open wounds b/l LE. No interdigital macerations noted b/l LE. Toenails 1-5 bilaterally well maintained with adequate length. No erythema, no edema, no drainage, no fluctuance. Porokeratotic lesion(s) submet head 2 b/l and submet head 3 b/l. No erythema, no edema, no drainage, no fluctuance.  Orthopedic: Normal muscle strength 5/5 to all lower extremity muscle groups bilaterally. No pain, crepitus or joint limitation noted with ROM b/l LE. No gross bony pedal deformities b/l. Patient ambulates independently without assistive aids. Plantarflexed metatarsal(s) 2nd metatarsal head b/l lower extremities and 3rd metatarsal head b/l lower extremities.   Radiographs:  None Assessment:   1. Porokeratosis   2. Pain in both feet    Plan:  Patient was evaluated and treated and all questions answered. Consent given for treatment as described below: -Examined patient. -Medicare ABN on file for paring of lesions. -Patient to continue soft, supportive shoe gear daily. -Painful porokeratotic lesion(s) submet head 2 b/l and submet head 3 b/l pared and enucleated with sterile scalpel blade without incident. Total number of lesions debrided=4. -Patient/POA to call should there be question/concern in the interim.  Return in about 3 months (around 05/12/2021).  Marzetta Board, DPM

## 2021-03-09 DIAGNOSIS — Z1231 Encounter for screening mammogram for malignant neoplasm of breast: Secondary | ICD-10-CM | POA: Diagnosis not present

## 2021-03-15 ENCOUNTER — Ambulatory Visit: Payer: Medicare HMO | Admitting: Podiatry

## 2021-03-19 DIAGNOSIS — F3342 Major depressive disorder, recurrent, in full remission: Secondary | ICD-10-CM | POA: Diagnosis not present

## 2021-03-19 DIAGNOSIS — M899 Disorder of bone, unspecified: Secondary | ICD-10-CM | POA: Diagnosis not present

## 2021-03-19 DIAGNOSIS — E782 Mixed hyperlipidemia: Secondary | ICD-10-CM | POA: Diagnosis not present

## 2021-03-19 DIAGNOSIS — Z0000 Encounter for general adult medical examination without abnormal findings: Secondary | ICD-10-CM | POA: Diagnosis not present

## 2021-03-19 DIAGNOSIS — G43909 Migraine, unspecified, not intractable, without status migrainosus: Secondary | ICD-10-CM | POA: Diagnosis not present

## 2021-03-19 DIAGNOSIS — E039 Hypothyroidism, unspecified: Secondary | ICD-10-CM | POA: Diagnosis not present

## 2021-03-19 DIAGNOSIS — D692 Other nonthrombocytopenic purpura: Secondary | ICD-10-CM | POA: Diagnosis not present

## 2021-03-19 DIAGNOSIS — R69 Illness, unspecified: Secondary | ICD-10-CM | POA: Diagnosis not present

## 2021-04-19 DIAGNOSIS — N6320 Unspecified lump in the left breast, unspecified quadrant: Secondary | ICD-10-CM | POA: Diagnosis not present

## 2021-04-19 DIAGNOSIS — R928 Other abnormal and inconclusive findings on diagnostic imaging of breast: Secondary | ICD-10-CM | POA: Diagnosis not present

## 2021-05-20 DIAGNOSIS — T7840XA Allergy, unspecified, initial encounter: Secondary | ICD-10-CM | POA: Diagnosis not present

## 2021-05-24 ENCOUNTER — Ambulatory Visit: Payer: Medicare HMO | Admitting: Podiatry

## 2021-05-24 ENCOUNTER — Encounter: Payer: Self-pay | Admitting: Podiatry

## 2021-05-24 ENCOUNTER — Other Ambulatory Visit: Payer: Self-pay

## 2021-05-24 DIAGNOSIS — Q828 Other specified congenital malformations of skin: Secondary | ICD-10-CM | POA: Diagnosis not present

## 2021-05-24 DIAGNOSIS — M79672 Pain in left foot: Secondary | ICD-10-CM | POA: Diagnosis not present

## 2021-05-24 DIAGNOSIS — M79671 Pain in right foot: Secondary | ICD-10-CM | POA: Diagnosis not present

## 2021-05-24 DIAGNOSIS — F3342 Major depressive disorder, recurrent, in full remission: Secondary | ICD-10-CM | POA: Insufficient documentation

## 2021-05-25 DIAGNOSIS — H35373 Puckering of macula, bilateral: Secondary | ICD-10-CM | POA: Diagnosis not present

## 2021-05-30 NOTE — Progress Notes (Signed)
Subjective: Sheri Ray is a 76 y.o. female patient seen today for follow up of  painful porokeratotic lesions .b/l feet  Pain prevent(s) comfortable ambulation. Aggravating factor is weightbearing with and without shoegear..   New problem(s)/concern(s) today: None    PCP is Mayra Neer, MD. Last visit was: 05/25/2021.  Allergies  Allergen Reactions   K-Y Jelly [Cream Base]     Other reaction(s): abd pain and irritation   Latex Itching    Burning, itching, pain   Other     Other reaction(s): abd pain and irritation   Pineapple Itching    Objective: Physical Exam  General: Patient is a pleasant 76 y.o. Caucasian female WD, WN in NAD. AAO x 3.   Neurovascular Examination: CFT <3 seconds b/l LE. Palpable pedal pulses b/l LE. Pedal hair present. No pain with calf compression b/l. Lower extremity skin temperature gradient within normal limits. No edema noted b/l LE. No ischemia or gangrene noted b/l LE. No cyanosis or clubbing noted b/l LE.  Protective sensation intact 5/5 intact bilaterally with 10g monofilament b/l. Vibratory sensation intact b/l. Proprioception intact bilaterally.  Dermatological:  Pedal skin is warm and supple b/l LE. No open wounds b/l LE. No interdigital macerations noted b/l LE. Toenails 1-5 b/l well maintained with adequate length. No erythema, no edema, no drainage, no fluctuance. Porokeratotic lesion(s) submet head 2 b/l and submet head 3 b/l. No erythema, no edema, no drainage, no fluctuance.  Musculoskeletal:  Muscle strength 5/5 to all lower extremity muscle groups bilaterally. No pain, crepitus or joint limitation noted with ROM bilateral LE. Plantarflexed metatarsal(s) 2nd metatarsal head b/l lower extremities and 3rd metatarsal head b/l lower extremities.  Assessment: 1. Porokeratosis   2. Pain in both feet    Plan: Patient was evaluated and treated and all questions answered. Consent given for treatment as described below: -Medicare ABN  signed for this year. Patient consents for services of paring of calluses  today. Copy has been placed in patient's chart. -Painful porokeratotic lesion(s) submet head 2 b/l and submet head 3 b/l pared and enucleated with sterile scalpel blade without incident. Total number of lesions debrided=4. -Patient/POA to call should there be question/concern in the interim.  Return in about 3 months (around 08/21/2021).  Marzetta Board, DPM

## 2021-06-04 DIAGNOSIS — R197 Diarrhea, unspecified: Secondary | ICD-10-CM | POA: Diagnosis not present

## 2021-06-04 DIAGNOSIS — R5383 Other fatigue: Secondary | ICD-10-CM | POA: Diagnosis not present

## 2021-06-04 DIAGNOSIS — N942 Vaginismus: Secondary | ICD-10-CM | POA: Diagnosis not present

## 2021-06-04 DIAGNOSIS — N952 Postmenopausal atrophic vaginitis: Secondary | ICD-10-CM | POA: Diagnosis not present

## 2021-06-08 DIAGNOSIS — H04123 Dry eye syndrome of bilateral lacrimal glands: Secondary | ICD-10-CM | POA: Diagnosis not present

## 2021-06-22 DIAGNOSIS — H04123 Dry eye syndrome of bilateral lacrimal glands: Secondary | ICD-10-CM | POA: Diagnosis not present

## 2021-07-05 DIAGNOSIS — N9411 Superficial (introital) dyspareunia: Secondary | ICD-10-CM | POA: Diagnosis not present

## 2021-08-16 DIAGNOSIS — F4323 Adjustment disorder with mixed anxiety and depressed mood: Secondary | ICD-10-CM | POA: Diagnosis not present

## 2021-08-16 DIAGNOSIS — R69 Illness, unspecified: Secondary | ICD-10-CM | POA: Diagnosis not present

## 2021-08-25 DIAGNOSIS — R69 Illness, unspecified: Secondary | ICD-10-CM | POA: Diagnosis not present

## 2021-08-25 DIAGNOSIS — F4323 Adjustment disorder with mixed anxiety and depressed mood: Secondary | ICD-10-CM | POA: Diagnosis not present

## 2021-08-27 ENCOUNTER — Encounter: Payer: Self-pay | Admitting: Podiatry

## 2021-08-27 ENCOUNTER — Ambulatory Visit: Payer: Medicare HMO | Admitting: Podiatry

## 2021-08-27 DIAGNOSIS — N952 Postmenopausal atrophic vaginitis: Secondary | ICD-10-CM | POA: Insufficient documentation

## 2021-08-27 DIAGNOSIS — M79672 Pain in left foot: Secondary | ICD-10-CM | POA: Diagnosis not present

## 2021-08-27 DIAGNOSIS — M79671 Pain in right foot: Secondary | ICD-10-CM

## 2021-08-27 DIAGNOSIS — Q828 Other specified congenital malformations of skin: Secondary | ICD-10-CM | POA: Diagnosis not present

## 2021-08-27 DIAGNOSIS — N942 Vaginismus: Secondary | ICD-10-CM | POA: Insufficient documentation

## 2021-09-01 DIAGNOSIS — R69 Illness, unspecified: Secondary | ICD-10-CM | POA: Diagnosis not present

## 2021-09-01 DIAGNOSIS — F4323 Adjustment disorder with mixed anxiety and depressed mood: Secondary | ICD-10-CM | POA: Diagnosis not present

## 2021-09-02 NOTE — Progress Notes (Signed)
  Subjective:  Patient ID: Sheri Ray, female    DOB: [birth year],  MRN: [id number]  Elenie Coven presents to clinic today for painful porokeratotic lesions b/l lower extremities. Pain prevent(s) comfortable ambulation. Aggravating factor is weightbearing with and without shoegear.  New problem(s): None.   PCP is Mayra Neer, MD , and last visit was June 04, 2021.  Allergies  Allergen Reactions   K-Y Jelly [Cream Base]     Other reaction(s): abd pain and irritation   Latex Itching    Burning, itching, pain   Other     Other reaction(s): abd pain and irritation   Pineapple Itching    Review of Systems: Negative except as noted in the HPI.  Objective: No changes noted in today's physical examination.  General: Patient is a pleasant 76 y.o. Caucasian female WD, WN in NAD. AAO x 3.   Neurovascular Examination: CFT <3 seconds b/l LE. Palpable pedal pulses b/l LE. Pedal hair present. No pain with calf compression b/l. Lower extremity skin temperature gradient within normal limits. No edema noted b/l LE. No ischemia or gangrene noted b/l LE. No cyanosis or clubbing noted b/l LE.  Protective sensation intact 5/5 intact bilaterally with 10g monofilament b/l. Vibratory sensation intact b/l. Proprioception intact bilaterally.  Dermatological:  Pedal skin is warm and supple b/l LE. No open wounds b/l LE. No interdigital macerations noted b/l LE. Toenails 1-5 b/l well maintained with adequate length. No erythema, no edema, no drainage, no fluctuance. Porokeratotic lesion(s) submet head 2 b/l and submet head 3 b/l. No erythema, no edema, no drainage, no fluctuance.  Musculoskeletal:  Muscle strength 5/5 to all lower extremity muscle groups bilaterally. No pain, crepitus or joint limitation noted with ROM bilateral LE. Plantarflexed metatarsal(s) 2nd metatarsal head b/l lower extremities and 3rd metatarsal head b/l lower extremities.  Assessment/Plan: 1. Porokeratosis   2.  Pain in both feet   -Consent given for treatment as described below: -Examined patient. -Medicare ABN on file for paring of calluses. -Porokeratotic lesion(s) submet head 2 b/l and submet head 3 b/l pared and enucleated with sterile scalpel blade without incident. Total number of lesions debrided=4. -Patient/POA to call should there be question/concern in the interim.   Return in about 3 months (around 11/27/2021).  Marzetta Board, DPM

## 2021-09-08 DIAGNOSIS — R69 Illness, unspecified: Secondary | ICD-10-CM | POA: Diagnosis not present

## 2021-09-08 DIAGNOSIS — F4323 Adjustment disorder with mixed anxiety and depressed mood: Secondary | ICD-10-CM | POA: Diagnosis not present

## 2021-09-15 DIAGNOSIS — F4323 Adjustment disorder with mixed anxiety and depressed mood: Secondary | ICD-10-CM | POA: Diagnosis not present

## 2021-09-15 DIAGNOSIS — R69 Illness, unspecified: Secondary | ICD-10-CM | POA: Diagnosis not present

## 2021-09-22 DIAGNOSIS — R69 Illness, unspecified: Secondary | ICD-10-CM | POA: Diagnosis not present

## 2021-09-22 DIAGNOSIS — F4323 Adjustment disorder with mixed anxiety and depressed mood: Secondary | ICD-10-CM | POA: Diagnosis not present

## 2021-09-27 DIAGNOSIS — F4323 Adjustment disorder with mixed anxiety and depressed mood: Secondary | ICD-10-CM | POA: Diagnosis not present

## 2021-09-27 DIAGNOSIS — R69 Illness, unspecified: Secondary | ICD-10-CM | POA: Diagnosis not present

## 2021-09-30 DIAGNOSIS — E039 Hypothyroidism, unspecified: Secondary | ICD-10-CM | POA: Diagnosis not present

## 2021-09-30 DIAGNOSIS — R69 Illness, unspecified: Secondary | ICD-10-CM | POA: Diagnosis not present

## 2021-09-30 DIAGNOSIS — R41840 Attention and concentration deficit: Secondary | ICD-10-CM | POA: Diagnosis not present

## 2021-09-30 DIAGNOSIS — Z23 Encounter for immunization: Secondary | ICD-10-CM | POA: Diagnosis not present

## 2021-09-30 DIAGNOSIS — E782 Mixed hyperlipidemia: Secondary | ICD-10-CM | POA: Diagnosis not present

## 2021-10-04 DIAGNOSIS — F4323 Adjustment disorder with mixed anxiety and depressed mood: Secondary | ICD-10-CM | POA: Diagnosis not present

## 2021-10-04 DIAGNOSIS — R69 Illness, unspecified: Secondary | ICD-10-CM | POA: Diagnosis not present

## 2021-10-11 DIAGNOSIS — F4323 Adjustment disorder with mixed anxiety and depressed mood: Secondary | ICD-10-CM | POA: Diagnosis not present

## 2021-10-11 DIAGNOSIS — R69 Illness, unspecified: Secondary | ICD-10-CM | POA: Diagnosis not present

## 2021-10-18 DIAGNOSIS — R69 Illness, unspecified: Secondary | ICD-10-CM | POA: Diagnosis not present

## 2021-10-18 DIAGNOSIS — F4323 Adjustment disorder with mixed anxiety and depressed mood: Secondary | ICD-10-CM | POA: Diagnosis not present

## 2021-10-25 DIAGNOSIS — F4323 Adjustment disorder with mixed anxiety and depressed mood: Secondary | ICD-10-CM | POA: Diagnosis not present

## 2021-10-25 DIAGNOSIS — R69 Illness, unspecified: Secondary | ICD-10-CM | POA: Diagnosis not present

## 2021-11-09 DIAGNOSIS — F4323 Adjustment disorder with mixed anxiety and depressed mood: Secondary | ICD-10-CM | POA: Diagnosis not present

## 2021-11-09 DIAGNOSIS — R69 Illness, unspecified: Secondary | ICD-10-CM | POA: Diagnosis not present

## 2021-11-17 DIAGNOSIS — F4323 Adjustment disorder with mixed anxiety and depressed mood: Secondary | ICD-10-CM | POA: Diagnosis not present

## 2021-11-17 DIAGNOSIS — R69 Illness, unspecified: Secondary | ICD-10-CM | POA: Diagnosis not present

## 2021-11-23 DIAGNOSIS — F4323 Adjustment disorder with mixed anxiety and depressed mood: Secondary | ICD-10-CM | POA: Diagnosis not present

## 2021-11-23 DIAGNOSIS — R69 Illness, unspecified: Secondary | ICD-10-CM | POA: Diagnosis not present

## 2021-11-26 ENCOUNTER — Encounter: Payer: Self-pay | Admitting: Podiatry

## 2021-11-26 ENCOUNTER — Ambulatory Visit: Payer: Medicare HMO | Admitting: Podiatry

## 2021-11-26 DIAGNOSIS — M79672 Pain in left foot: Secondary | ICD-10-CM | POA: Diagnosis not present

## 2021-11-26 DIAGNOSIS — Q828 Other specified congenital malformations of skin: Secondary | ICD-10-CM

## 2021-11-26 DIAGNOSIS — M79671 Pain in right foot: Secondary | ICD-10-CM

## 2021-11-26 NOTE — Progress Notes (Signed)
  Subjective:  Patient ID: Sheri Ray, female    DOB: 29-Sep-1945,  MRN: [id number]  Joyell Emami presents to clinic today for painful porokeratotic lesions b/l lower extremities. Pain prevent(s) comfortable ambulation. Aggravating factor is weightbearing with and without shoegear.  Patient states she will be traveling to her last 6 states to complete seeing all 50 states. She will be going hiking. She is following the [place] and [place] route.  PCP is Mayra Neer, MD , and last visit was June 04, 2021.  Allergies  Allergen Reactions   K-Y Jelly [Cream Base]     Other reaction(s): abd pain and irritation   Latex Itching    Burning, itching, pain   Other     Other reaction(s): abd pain and irritation   Pineapple Itching    Review of Systems: Negative except as noted in the HPI.  Objective: No changes noted in today's physical examination.  Vascular Examination: CFT <3 seconds b/l LE.Palpable pedal pulses b/l LE. Digital hair present b/l. No pedal edema b/l. Skin temperature gradient WNL b/l. No varicosities b/l. Marland Kitchen  Dermatological Examination: Pedal skin with normal turgor, texture and tone b/l. No open wounds. No interdigital macerations b/l. Toenails 1-5 b/l well maintained with adequate length. No erythema, no edema, no drainage, no fluctuance. Porokeratotic lesion(s) submet head 2 b/l and submet head 3 b/l. No erythema, no edema, no drainage, no fluctuance..  Neurological Examination: Protective sensation intact with 10 gram monofilament b/l LE. Vibratory sensation intact b/l LE.   Musculoskeletal Examination: Muscle strength 5/5 to all LE muscle groups b/l. Plantarflexed metatarsal(s) 2nd metatarsal head b/l lower extremities and 3rd metatarsal head b/l lower extremities.  Assessment/Plan: 1. Porokeratosis   2. Pain in both feet   -Examined patient. -No new findings. No new orders. -Medicare ABN signed for services of paring of  corn(s)/callus(es)/porokeratos(es). Copy in patient chart. -Patient to continue soft, supportive shoe gear daily. -Porokeratotic lesion(s) submet head 2 b/l and submet head 3 b/l pared and enucleated with sterile scalpel blade without incident. Total number of lesions debrided=4. -Patient/POA to call should there be question/concern in the interim.   Return in about 3 months (around 02/25/2022).  Marzetta Board, DPM

## 2022-01-19 DIAGNOSIS — H04123 Dry eye syndrome of bilateral lacrimal glands: Secondary | ICD-10-CM | POA: Diagnosis not present

## 2022-02-07 DIAGNOSIS — Z85828 Personal history of other malignant neoplasm of skin: Secondary | ICD-10-CM | POA: Diagnosis not present

## 2022-02-07 DIAGNOSIS — D1801 Hemangioma of skin and subcutaneous tissue: Secondary | ICD-10-CM | POA: Diagnosis not present

## 2022-02-07 DIAGNOSIS — D3617 Benign neoplasm of peripheral nerves and autonomic nervous system of trunk, unspecified: Secondary | ICD-10-CM | POA: Diagnosis not present

## 2022-02-07 DIAGNOSIS — L814 Other melanin hyperpigmentation: Secondary | ICD-10-CM | POA: Diagnosis not present

## 2022-02-07 DIAGNOSIS — D2372 Other benign neoplasm of skin of left lower limb, including hip: Secondary | ICD-10-CM | POA: Diagnosis not present

## 2022-02-07 DIAGNOSIS — D485 Neoplasm of uncertain behavior of skin: Secondary | ICD-10-CM | POA: Diagnosis not present

## 2022-02-07 DIAGNOSIS — L820 Inflamed seborrheic keratosis: Secondary | ICD-10-CM | POA: Diagnosis not present

## 2022-02-11 DIAGNOSIS — R5383 Other fatigue: Secondary | ICD-10-CM | POA: Diagnosis not present

## 2022-02-11 DIAGNOSIS — R197 Diarrhea, unspecified: Secondary | ICD-10-CM | POA: Diagnosis not present

## 2022-02-11 DIAGNOSIS — R69 Illness, unspecified: Secondary | ICD-10-CM | POA: Diagnosis not present

## 2022-03-01 ENCOUNTER — Encounter: Payer: Self-pay | Admitting: Podiatry

## 2022-03-01 ENCOUNTER — Ambulatory Visit: Payer: Medicare HMO | Admitting: Podiatry

## 2022-03-01 VITALS — BP 154/81

## 2022-03-01 DIAGNOSIS — M79672 Pain in left foot: Secondary | ICD-10-CM

## 2022-03-01 DIAGNOSIS — M79671 Pain in right foot: Secondary | ICD-10-CM

## 2022-03-01 DIAGNOSIS — Q828 Other specified congenital malformations of skin: Secondary | ICD-10-CM

## 2022-03-01 NOTE — Progress Notes (Signed)
  Subjective:  Patient ID: Sheri Ray, female    DOB: [birth year],  MRN: [id number]  Jenyfer Trawick presents to clinic today for painful porokeratotic lesions b/l lower extremities. Pain prevent(s) comfortable ambulation. Aggravating factor is weightbearing with and without shoegear.  Chief Complaint  Patient presents with   Porokeratosis    Routine foot care [facility] PCP VST-09/2021    She enjoyed her road trip vacation with her friend to see the last 61 states in the [place]. She has officially seen all 51 states.  New problem(s): None.   PCP is Mayra Neer, MD.  Allergies  Allergen Reactions   K-Y Jelly [Cream Base]     Other reaction(s): abd pain and irritation   Latex Itching    Burning, itching, pain   Other     Other reaction(s): abd pain and irritation   Pineapple Itching    Review of Systems: Negative except as noted in the HPI.  Objective: No changes noted in today's physical examination. There were no vitals filed for this visit. Rikia Sukhu is a pleasant 76 y.o. female WD, WN in NAD. AAO x 3.  Vascular Examination: CFT <3 seconds b/l LE.Palpable pedal pulses b/l LE. Digital hair present b/l. No pedal edema b/l. Skin temperature gradient WNL b/l. No varicosities b/l. Marland Kitchen  Dermatological Examination: Pedal skin with normal turgor, texture and tone b/l. No open wounds. No interdigital macerations b/l.   Toenails 1-5 b/l well maintained with adequate length. No erythema, no edema, no drainage, no fluctuance.   Porokeratotic lesion(s) submet head 2 b/l and submet head 3 b/l. No erythema, no edema, no drainage, no fluctuance.  Neurological Examination: Protective sensation intact with 10 gram monofilament b/l LE. Vibratory sensation intact b/l LE.   Musculoskeletal Examination: Muscle strength 5/5 to all LE muscle groups b/l. Plantarflexed metatarsal(s) 2nd metatarsal head b/l lower extremities and 3rd metatarsal head b/l lower  extremities.  Assessment/Plan: 1. Porokeratosis   2. Pain in both feet     No orders of the defined types were placed in this encounter.  -Patient was evaluated and treated. All patient's and/or POA's questions/concerns answered on today's visit. -No new findings. No new orders. -As a courtesy, porokeratotic lesion(s) submet head 2 b/l and submet head 3 b/l pared and enucleated without complication or incident. Total number pared=4. -Patient/POA to call should there be question/concern in the interim.   Return in about 3 months (around 05/31/2022).  Marzetta Board, DPM

## 2022-03-15 DIAGNOSIS — Z1231 Encounter for screening mammogram for malignant neoplasm of breast: Secondary | ICD-10-CM | POA: Diagnosis not present

## 2022-03-23 ENCOUNTER — Other Ambulatory Visit: Payer: Self-pay | Admitting: Podiatry

## 2022-03-29 ENCOUNTER — Telehealth: Payer: Self-pay | Admitting: *Deleted

## 2022-03-29 NOTE — Telephone Encounter (Signed)
Patient is calling for a refill of the diclofenac -50 mg tablets, explained that a f/u appointment is needed to reevaluate to see if medication is still needed since it has been so long. She verbalized understanding and will schedule.

## 2022-03-31 DIAGNOSIS — N952 Postmenopausal atrophic vaginitis: Secondary | ICD-10-CM | POA: Diagnosis not present

## 2022-03-31 DIAGNOSIS — R69 Illness, unspecified: Secondary | ICD-10-CM | POA: Diagnosis not present

## 2022-03-31 DIAGNOSIS — D692 Other nonthrombocytopenic purpura: Secondary | ICD-10-CM | POA: Diagnosis not present

## 2022-03-31 DIAGNOSIS — R030 Elevated blood-pressure reading, without diagnosis of hypertension: Secondary | ICD-10-CM | POA: Diagnosis not present

## 2022-03-31 DIAGNOSIS — Z0000 Encounter for general adult medical examination without abnormal findings: Secondary | ICD-10-CM | POA: Diagnosis not present

## 2022-03-31 DIAGNOSIS — G43909 Migraine, unspecified, not intractable, without status migrainosus: Secondary | ICD-10-CM | POA: Diagnosis not present

## 2022-03-31 DIAGNOSIS — M899 Disorder of bone, unspecified: Secondary | ICD-10-CM | POA: Diagnosis not present

## 2022-03-31 DIAGNOSIS — E782 Mixed hyperlipidemia: Secondary | ICD-10-CM | POA: Diagnosis not present

## 2022-03-31 DIAGNOSIS — E039 Hypothyroidism, unspecified: Secondary | ICD-10-CM | POA: Diagnosis not present

## 2022-03-31 DIAGNOSIS — M1990 Unspecified osteoarthritis, unspecified site: Secondary | ICD-10-CM | POA: Diagnosis not present

## 2022-04-25 DIAGNOSIS — I10 Essential (primary) hypertension: Secondary | ICD-10-CM | POA: Diagnosis not present

## 2022-04-25 DIAGNOSIS — R109 Unspecified abdominal pain: Secondary | ICD-10-CM | POA: Diagnosis not present

## 2022-05-06 DIAGNOSIS — K429 Umbilical hernia without obstruction or gangrene: Secondary | ICD-10-CM | POA: Diagnosis not present

## 2022-05-06 DIAGNOSIS — K8020 Calculus of gallbladder without cholecystitis without obstruction: Secondary | ICD-10-CM | POA: Diagnosis not present

## 2022-05-06 DIAGNOSIS — K439 Ventral hernia without obstruction or gangrene: Secondary | ICD-10-CM | POA: Diagnosis not present

## 2022-05-06 DIAGNOSIS — I700 Atherosclerosis of aorta: Secondary | ICD-10-CM | POA: Diagnosis not present

## 2022-05-26 DIAGNOSIS — H35373 Puckering of macula, bilateral: Secondary | ICD-10-CM | POA: Diagnosis not present

## 2022-06-22 DIAGNOSIS — H35372 Puckering of macula, left eye: Secondary | ICD-10-CM | POA: Diagnosis not present

## 2022-06-22 DIAGNOSIS — H43813 Vitreous degeneration, bilateral: Secondary | ICD-10-CM | POA: Diagnosis not present

## 2022-06-22 DIAGNOSIS — H26492 Other secondary cataract, left eye: Secondary | ICD-10-CM | POA: Diagnosis not present

## 2022-06-22 DIAGNOSIS — H35443 Age-related reticular degeneration of retina, bilateral: Secondary | ICD-10-CM | POA: Diagnosis not present

## 2022-06-28 DIAGNOSIS — K439 Ventral hernia without obstruction or gangrene: Secondary | ICD-10-CM | POA: Diagnosis not present

## 2022-07-01 ENCOUNTER — Encounter: Payer: Self-pay | Admitting: Podiatry

## 2022-07-01 ENCOUNTER — Ambulatory Visit: Payer: Medicare HMO | Admitting: Podiatry

## 2022-07-01 DIAGNOSIS — M79671 Pain in right foot: Secondary | ICD-10-CM | POA: Diagnosis not present

## 2022-07-01 DIAGNOSIS — Q828 Other specified congenital malformations of skin: Secondary | ICD-10-CM

## 2022-07-01 DIAGNOSIS — M79672 Pain in left foot: Secondary | ICD-10-CM

## 2022-07-03 NOTE — Progress Notes (Signed)
  Subjective:  Patient ID: Sheri Ray, female    DOB: [birth year],  MRN: [id number]  Sheri Ray presents to clinic today for painful porokeratotic lesions both feet. Pain prevent(s) comfortable ambulation. Aggravating factor is weightbearing with and without shoegear.  Chief Complaint  Patient presents with   Callouses    Callus debridement    New problem(s): None.   PCP is Lupita Raider, MD.  Allergies  Allergen Reactions   K-Y Jelly [Cream Base]     Other reaction(s): abd pain and irritation   Latex Itching    Burning, itching, pain   Other     Other reaction(s): abd pain and irritation   Pineapple Itching    Review of Systems: Negative except as noted in the HPI.  Objective:  There were no vitals filed for this visit. Sheri Ray is a pleasant 77 y.o. female WD, WN in NAD. AAO x 3.  Vascular Examination: CFT <3 seconds b/l LE.Palpable pedal pulses b/l LE. Digital hair present b/l. No pedal edema b/l. Skin temperature gradient WNL b/l. No varicosities b/l. Marland Kitchen  Dermatological Examination: Pedal skin with normal turgor, texture and tone b/l. No open wounds. No interdigital macerations b/l.   Toenails 2-5 b/l well maintained with adequate length. No erythema, no edema, no drainage, no fluctuance.   Bilateral great toenails incurvated with tenderness to palpation. No erythema, no edema, no drainage, no fluctuance.  Porokeratotic lesion(s) submet head 2 b/l and submet head 3 b/l. No erythema, no edema, no drainage, no fluctuance.  Neurological Examination: Protective sensation intact with 10 gram monofilament b/l LE. Vibratory sensation intact b/l LE.   Musculoskeletal Examination: Muscle strength 5/5 to all LE muscle groups b/l. Plantarflexed metatarsal(s) 2nd metatarsal head b/l lower extremities and 3rd metatarsal head b/l lower extremities.  Assessment/Plan: 1. Porokeratosis   2. Pain in both feet   -Consent given for treatment as described  below: -Examined patient. -Porokeratotic lesion(s) submet head 2 b/l and submet head 3 b/l pared and enucleated with sterile currette without incident. Total number of lesions debrided=4. -As a courtesy, offending nail borders debrided without incident and with relief of pain symptoms b/l great toes. -Patient/POA to call should there be question/concern in the interim.   Return in about 3 months (around 09/30/2022).  Sheri Ray, DPM

## 2022-07-07 ENCOUNTER — Other Ambulatory Visit: Payer: Self-pay | Admitting: Family Medicine

## 2022-07-07 DIAGNOSIS — R911 Solitary pulmonary nodule: Secondary | ICD-10-CM

## 2022-07-13 DIAGNOSIS — M8589 Other specified disorders of bone density and structure, multiple sites: Secondary | ICD-10-CM | POA: Diagnosis not present

## 2022-07-13 DIAGNOSIS — Z780 Asymptomatic menopausal state: Secondary | ICD-10-CM | POA: Diagnosis not present

## 2022-07-18 DIAGNOSIS — I10 Essential (primary) hypertension: Secondary | ICD-10-CM | POA: Diagnosis not present

## 2022-08-03 ENCOUNTER — Observation Stay (HOSPITAL_COMMUNITY)
Admission: EM | Admit: 2022-08-03 | Discharge: 2022-08-04 | Disposition: A | Discharge status: Home / Self Care | Payer: Medicare HMO | Attending: Internal Medicine | Admitting: Internal Medicine

## 2022-08-03 ENCOUNTER — Emergency Department (HOSPITAL_COMMUNITY): Payer: Medicare HMO

## 2022-08-03 ENCOUNTER — Encounter (HOSPITAL_COMMUNITY): Payer: Self-pay

## 2022-08-03 DIAGNOSIS — I700 Atherosclerosis of aorta: Secondary | ICD-10-CM | POA: Diagnosis not present

## 2022-08-03 DIAGNOSIS — E785 Hyperlipidemia, unspecified: Secondary | ICD-10-CM | POA: Diagnosis present

## 2022-08-03 DIAGNOSIS — E782 Mixed hyperlipidemia: Secondary | ICD-10-CM | POA: Diagnosis present

## 2022-08-03 DIAGNOSIS — R079 Chest pain, unspecified: Secondary | ICD-10-CM | POA: Diagnosis not present

## 2022-08-03 DIAGNOSIS — J45909 Unspecified asthma, uncomplicated: Secondary | ICD-10-CM | POA: Insufficient documentation

## 2022-08-03 DIAGNOSIS — I25118 Atherosclerotic heart disease of native coronary artery with other forms of angina pectoris: Secondary | ICD-10-CM | POA: Diagnosis not present

## 2022-08-03 DIAGNOSIS — Z79899 Other long term (current) drug therapy: Secondary | ICD-10-CM | POA: Insufficient documentation

## 2022-08-03 DIAGNOSIS — R911 Solitary pulmonary nodule: Secondary | ICD-10-CM | POA: Diagnosis not present

## 2022-08-03 DIAGNOSIS — I351 Nonrheumatic aortic (valve) insufficiency: Secondary | ICD-10-CM | POA: Diagnosis not present

## 2022-08-03 DIAGNOSIS — I77810 Thoracic aortic ectasia: Secondary | ICD-10-CM | POA: Insufficient documentation

## 2022-08-03 DIAGNOSIS — I214 Non-ST elevation (NSTEMI) myocardial infarction: Principal | ICD-10-CM | POA: Diagnosis present

## 2022-08-03 DIAGNOSIS — Z91040 Latex allergy status: Secondary | ICD-10-CM | POA: Insufficient documentation

## 2022-08-03 DIAGNOSIS — Z743 Need for continuous supervision: Secondary | ICD-10-CM | POA: Diagnosis not present

## 2022-08-03 DIAGNOSIS — R231 Pallor: Secondary | ICD-10-CM | POA: Diagnosis not present

## 2022-08-03 DIAGNOSIS — R0602 Shortness of breath: Secondary | ICD-10-CM | POA: Diagnosis not present

## 2022-08-03 DIAGNOSIS — I10 Essential (primary) hypertension: Secondary | ICD-10-CM | POA: Diagnosis not present

## 2022-08-03 DIAGNOSIS — R0789 Other chest pain: Secondary | ICD-10-CM

## 2022-08-03 DIAGNOSIS — I959 Hypotension, unspecified: Secondary | ICD-10-CM | POA: Diagnosis not present

## 2022-08-03 DIAGNOSIS — J9811 Atelectasis: Secondary | ICD-10-CM | POA: Diagnosis not present

## 2022-08-03 LAB — CBC WITH DIFFERENTIAL/PLATELET
Abs Immature Granulocytes: 0.01 10*3/uL (ref 0.00–0.07)
Basophils Absolute: 0 10*3/uL (ref 0.0–0.1)
Basophils Relative: 1 %
Eosinophils Absolute: 0.1 10*3/uL (ref 0.0–0.5)
Eosinophils Relative: 2 %
HCT: 35.7 % — ABNORMAL LOW (ref 36.0–46.0)
Hemoglobin: 12.6 g/dL (ref 12.0–15.0)
Immature Granulocytes: 0 %
Lymphocytes Relative: 19 %
Lymphs Abs: 1.1 10*3/uL (ref 0.7–4.0)
MCH: 28.8 pg (ref 26.0–34.0)
MCHC: 35.3 g/dL (ref 30.0–36.0)
MCV: 81.7 fL (ref 80.0–100.0)
Monocytes Absolute: 0.3 10*3/uL (ref 0.1–1.0)
Monocytes Relative: 6 %
Neutro Abs: 3.9 10*3/uL (ref 1.7–7.7)
Neutrophils Relative %: 72 %
Platelets: 169 10*3/uL (ref 150–400)
RBC: 4.37 MIL/uL (ref 3.87–5.11)
RDW: 14.2 % (ref 11.5–15.5)
WBC: 5.4 10*3/uL (ref 4.0–10.5)
nRBC: 0 % (ref 0.0–0.2)

## 2022-08-03 LAB — COMPREHENSIVE METABOLIC PANEL
ALT: 17 U/L (ref 0–44)
AST: 25 U/L (ref 15–41)
Albumin: 3.1 g/dL — ABNORMAL LOW (ref 3.5–5.0)
Alkaline Phosphatase: 44 U/L (ref 38–126)
Anion gap: 7 (ref 5–15)
BUN: 8 mg/dL (ref 8–23)
CO2: 23 mmol/L (ref 22–32)
Calcium: 8.7 mg/dL — ABNORMAL LOW (ref 8.9–10.3)
Chloride: 100 mmol/L (ref 98–111)
Creatinine, Ser: 0.81 mg/dL (ref 0.44–1.00)
GFR, Estimated: >60 mL/min (ref >60)
Glucose, Bld: 85 mg/dL (ref 70–99)
Potassium: 3.8 mmol/L (ref 3.5–5.1)
Sodium: 130 mmol/L — ABNORMAL LOW (ref 135–145)
Total Bilirubin: 0.5 mg/dL (ref 0.3–1.2)
Total Protein: 6 g/dL — ABNORMAL LOW (ref 6.5–8.1)

## 2022-08-03 LAB — LIPASE, BLOOD: Lipase: 32 U/L (ref 11–51)

## 2022-08-03 LAB — MAGNESIUM: Magnesium: 2.1 mg/dL (ref 1.7–2.4)

## 2022-08-03 LAB — TROPONIN I (HIGH SENSITIVITY)
Troponin I (High Sensitivity): 30 ng/L — ABNORMAL HIGH (ref <18)
Troponin I (High Sensitivity): 39 ng/L — ABNORMAL HIGH (ref <18)

## 2022-08-03 LAB — BRAIN NATRIURETIC PEPTIDE: B Natriuretic Peptide: 93.6 pg/mL (ref 0.0–100.0)

## 2022-08-03 MED ORDER — IOHEXOL 350 MG/ML SOLN
75.0000 mL | Freq: Once | INTRAVENOUS | Status: AC | PRN
  Administered 2022-08-03: 75 mL via INTRAVENOUS

## 2022-08-03 NOTE — ED Triage Notes (Signed)
Pt c/o chest tightness & SHOB after [employer] lessons and felt chest tightness across her chest today around 16:30. Pt reports chest tightness has resolved but still SHOB.[facility] picked pt up at Urgent [facility].

## 2022-08-03 NOTE — ED Provider Notes (Signed)
[place] EMERGENCY DEPARTMENT AT [facility] Provider Note   CSN: [id number] Arrival date & time: 08/03/22  1937     History  Chief Complaint  Patient presents with   Chest Pain    Sheri Ray is a 77 y.o. female.   Chest Pain Associated symptoms: fatigue and shortness of breath   Patient presents for chest tightness and shortness of breath.  Medical history includes HTN, HLD, migraine headaches, depression, asthma, arthritis.  Patient was in her normal state of health earlier today.  She went to [facility] class this afternoon.  She was leaving, she was exerting herself loading equipment into her car.  At that point, she experienced shortness of breath.  She went to a store and 15 minutes after onset of shortness of breath, she experienced a chest tightness.  She describes it as a squeezing sensation in her bilateral aspects of lower chest.  Symptoms persisted for approximately 1.5 hours.  She went to urgent care where an ambulance was called to bring her to the ED.  She has received 324 of ASA prior to arrival.  She has had sustained resolution of chest tightness and shortness of breath for the past 2 hours.  She does endorse continued fatigue.  Patient has no known cardiac history.  She has never seen a cardiologist.  She does not smoke.  She has no known family history of early cardiac disease.     Home Medications Prior to Admission medications   Medication Sig Start Date End Date Taking? Authorizing Provider  acetaminophen (TYLENOL) 500 MG tablet Take 500 mg by mouth every 6 (six) hours as needed for mild pain.   Yes [provider]  Calcium-Vitamin D 600-200 MG-UNIT per tablet Take 1 tablet by mouth 2 (two) times daily.   Yes [provider]  cetirizine (ZYRTEC ALLERGY) 10 MG tablet 1 tablet    [provider]  cholecalciferol (VITAMIN D3) 25 MCG (1000 UNIT) tablet 1 tablet    [provider]  citalopram (CELEXA) 40 MG tablet  Take 40 mg by mouth daily. 05/17/20   [provider]  Cyanocobalamin (VITAMIN B 12) 500 MCG TABS 1 tablet    [provider]  Cyanocobalamin (VITAMIN B 12) 500 MCG TABS 2 tablet    [provider]  cyclobenzaprine (FLEXERIL) 5 MG tablet Take 5 mg by mouth 3 (three) times daily as needed. 10/29/19   [provider]  diclofenac (VOLTAREN) 50 MG EC tablet TAKE 1 TABLET BY MOUTH TWICE A DAY 08/17/20   Felecia Shelling, DPM  EYSUVIS 0.25 % SUSP Apply 1 drop to eye 2 (two) times daily. 06/08/21   [provider]  Garlic 2000 MG TBEC 1 tablet    [provider]  GARLIC PO Take 1 tablet by mouth daily.    [provider]  Ginkgo Biloba 60 MG CAPS See admin instructions.    [provider]  ibuprofen (ADVIL,MOTRIN) 200 MG tablet Take 200 mg by mouth every 6 (six) hours as needed.    [provider]  Lactobacillus (ACIDOPHILUS) 0.5 MG TABS Take by mouth.    [provider]  Lactobacillus (ACIDOPHILUS) 100 MG CAPS     [provider]  loratadine (CLARITIN) 10 MG tablet Take 10 mg by mouth daily.    [provider]  loratadine (CLARITIN) 10 MG tablet 1 tablet    [provider]  Magnesium 250 MG TABS 1 tablet with a meal  [provider]  ofloxacin (OCUFLOX) 0.3 % ophthalmic solution Instill 1 drop into left eye four times a day  STARTING 1 DAY BEFORE SURGERY 04/07/20   [provider]  olmesartan (BENICAR) 20 MG tablet  06/22/22   [provider]  Omega-3 Fatty Acids (FISH OIL) 1200 MG CAPS 1 capsule    [provider]  PAXLOVID 20 x 150 MG & 10 x 100MG  TBPK Take 3 tablets by mouth 2 (two) times daily. 08/11/20   [provider]  prednisoLONE acetate (PRED FORTE) 1 % ophthalmic suspension PLACE ONE DROP INTO OPERATIVE EYE FOUR TIMES A DAY, BEGINNING AFTER SURGERY 04/07/20   [provider]  predniSONE (DELTASONE) 20 MG tablet 1 tablet 05/20/21    [provider]  PREMARIN vaginal cream Place vaginally. 05/20/21   [provider]  PROLENSA 0.07 % SOLN PLACE ONE DROP INTO OPERATIVE EYE ONCE DAILY, BEGINNING ONE DAY PRIOR TO SURGERY 04/07/20   [provider]  SUMAtriptan (IMITREX) 100 MG tablet TAKE 1 TABLET BY MOUTH AS NEEDED FOR HEADACHE 08/12/17   [provider]  thyroid (ARMOUR THYROID) 60 MG tablet Take 30-60 mg by mouth See admin instructions. TAKES 30MG  ONLY ON MON'S  TAKES 60MG  ALL OTHER DAYS    [provider]  Turmeric 500 MG CAPS See admin instructions.    [provider]  Zinc 30 MG TABS 1 tablet    [provider]  zinc gluconate 50 MG tablet Take 50 mg by mouth daily.    [provider]      Allergies    Latex and Pineapple    Review of Systems   Review of Systems  Constitutional:  Positive for fatigue.  Respiratory:  Positive for chest tightness and shortness of breath.   All other systems reviewed and are negative.   Physical Exam Updated Vital Signs BP 134/87   Pulse 69   Temp 97.7 F (36.5 C) (Oral)   Resp 16   Ht 5\' 2"  (1.575 m)   Wt 63.5 kg   SpO2 100%   BMI 25.61 kg/m  Physical Exam Vitals and nursing note reviewed.  Constitutional:      General: She is not in acute distress.    Appearance: She is well-developed. She is not ill-appearing, toxic-appearing or diaphoretic.  HENT:     Head: Normocephalic and atraumatic.  Eyes:     Extraocular Movements: Extraocular movements intact.     Conjunctiva/sclera: Conjunctivae normal.  Cardiovascular:     Rate and Rhythm: Normal rate and regular rhythm.     Heart sounds: No murmur heard. Pulmonary:     Effort: Pulmonary effort is normal. No respiratory distress.     Breath sounds: Normal breath sounds. No decreased breath sounds, wheezing, rhonchi or rales.  Chest:     Chest wall: No tenderness.  Abdominal:     Palpations: Abdomen is soft.     Tenderness: There is no abdominal  tenderness.  Musculoskeletal:        General: No swelling.     Cervical back: Normal range of motion and neck supple.     Right lower leg: No edema.     Left lower leg: No edema.  Skin:    General: Skin is warm and dry.     Capillary Refill: Capillary refill takes less than 2 seconds.     Coloration: Skin is not cyanotic or pale.  Neurological:     General: No focal deficit present.  Mental Status: She is alert and oriented to person, place, and time.  Psychiatric:        Mood and Affect: Mood normal.        Behavior: Behavior normal.     ED Results / Procedures / Treatments   Labs (all labs ordered are listed, but only abnormal results are displayed) Labs Reviewed  COMPREHENSIVE METABOLIC PANEL - Abnormal; Notable for the following components:      Result Value   Sodium 130 (*)    Calcium 8.7 (*)    Total Protein 6.0 (*)    Albumin 3.1 (*)    All other components within normal limits  CBC WITH DIFFERENTIAL/PLATELET - Abnormal; Notable for the following components:   HCT 35.7 (*)    All other components within normal limits  TROPONIN I (HIGH SENSITIVITY) - Abnormal; Notable for the following components:   Troponin I (High Sensitivity) 30 (*)    All other components within normal limits  MAGNESIUM  LIPASE, BLOOD  BRAIN NATRIURETIC PEPTIDE  TROPONIN I (HIGH SENSITIVITY)    EKG EKG Interpretation  Date/Time:  Wednesday Aug 03 2022 19:47:03 EDT Ventricular Rate:  57 PR Interval:  194 QRS Duration: 120 QT Interval:  449 QTC Calculation: 438 R Axis:   27 Text Interpretation: Sinus rhythm Probable left atrial enlargement IVCD, consider atypical RBBB Confirmed by Gloris Manchester ([id number]) on 08/03/2022 9:53:20 PM  Radiology CT Angio Chest PE W and/or Wo Contrast  Result Date: 08/03/2022 CLINICAL DATA:  Concern for palmar thumb. EXAM: CT ANGIOGRAPHY CHEST WITH CONTRAST TECHNIQUE: Multidetector CT imaging of the chest was performed using the standard protocol during bolus  administration of intravenous contrast. Multiplanar CT image reconstructions and MIPs were obtained to evaluate the vascular anatomy. RADIATION DOSE REDUCTION: This exam was performed according to the departmental dose-optimization program which includes automated exposure control, adjustment of the mA and/or kV according to patient size and/or use of iterative reconstruction technique. CONTRAST:  75mL OMNIPAQUE IOHEXOL 350 MG/ML SOLN COMPARISON:  Chest radiograph dated 08/03/2022. FINDINGS: Cardiovascular: Mild cardiomegaly. No pericardial effusion. There is mild atherosclerotic calcification of the thoracic aorta. Top-normal ascending aorta measure up to 4 cm in diameter. The origins of the great vessels of the aortic arch appear patent. No pulmonary artery embolus identified. Mediastinum/Nodes: No hilar or mediastinal adenopathy. The esophagus is grossly unremarkable. No mediastinal fluid collection. Lungs/Pleura: There is a 1.4 cm nodule in the right middle lobe. There is a 6 mm nodule versus a para fissural lymph node along the left fissure (62/6). Minimal bibasilar atelectasis. No focal consolidation, pleural effusion, or pneumothorax. The central airways are patent. Upper Abdomen: No acute abnormality. Musculoskeletal: Osteopenia with degenerative changes of the spine. No acute osseous pathology. Review of the MIP images confirms the above findings. IMPRESSION: 1. No acute intrathoracic pathology. No pulmonary artery embolus identified. 2. A 1.4 cm right middle lobe nodule. Consider one of the following in 3 months for both low-risk and high-risk individuals: (a) repeat chest CT, (b) follow-up PET-CT, or (c) tissue sampling. This recommendation follows the consensus statement: Guidelines for Management of Incidental Pulmonary Nodules Detected on CT Images: From the Fleischner Society 2017; Radiology 2017; 284:228-243. 3.  Aortic Atherosclerosis (ICD10-I70.0). Electronically Signed   By: Elgie Collard M.D.    On: 08/03/2022 23:00   DG Chest Portable 1 View  Result Date: 08/03/2022 CLINICAL DATA:  Chest tightness and shortness of breath. EXAM: PORTABLE CHEST 1 VIEW COMPARISON:  Chest radiograph 12/26/2017 FINDINGS: Heart is upper normal  in size. Stable mediastinal contours. Subsegmental atelectasis or scarring in the left mid lower lung zone. There is a 9 mm nodular density projecting over the right lower lung zone. Normal pulmonary vasculature. No significant pleural effusion. No pneumothorax. IMPRESSION: 1. Subsegmental atelectasis or scarring in the left mid lower lung zone. 2. A 9 mm nodular density projecting over the right lower lung zone. This is higher than would be expected for a nipple shadow. Recommend further evaluation with chest CT (could be performed on an elective basis). Electronically Signed   By: Narda Rutherford M.D.   On: 08/03/2022 20:26    Procedures Procedures    Medications Ordered in ED Medications  iohexol (OMNIPAQUE) 350 MG/ML injection 75 mL (75 mLs Intravenous Contrast Given 08/03/22 2243)    ED Course/ Medical Decision Making/ A&P                             Medical Decision Making Amount and/or Complexity of Data Reviewed Labs: ordered. Radiology: ordered.  Risk Prescription drug management.   This patient presents to the ED for concern of shortness of breath and chest tightness, this involves an extensive number of treatment options, and is a complaint that carries with it a high risk of complications and morbidity.  The differential diagnosis includes ACS, GERD, reactive airway disease, anxiety, PE   Co morbidities that complicate the patient evaluation  HTN, HLD, migraine headaches, depression, asthma, arthritis   Additional history obtained:  Additional history obtained from patient's husband External records from outside source obtained and reviewed including EMR   Lab Tests:  I Ordered, and personally interpreted labs.  The pertinent results  include: Normal hemoglobin, no leukocytosis, normal lipase, normal kidney function, normal electrolytes.  Initial troponin is slightly elevated.   Imaging Studies ordered:  I ordered imaging studies including chest x-ray, CTA chest I independently visualized and interpreted imaging which showed x-ray showed right lower lung nodular density.  CTA was pending at time of signout. I agree with the radiologist interpretation   Cardiac Monitoring: / EKG:  The patient was maintained on a cardiac monitor.  I personally viewed and interpreted the cardiac monitored which showed an underlying rhythm of: Sinus rhythm   Consultations Obtained:  I requested consultation with the cardiologist, Dr. Hyacinth Meeker,  and discussed lab and imaging findings as well as pertinent plan - they recommend: Cardiology evaluation in the ED.   Problem List / ED Course / Critical interventions / Medication management  Patient presents for chest tightness, shortness of breath, and fatigue.  Onset of symptoms did follow a exertional episode.  Symptoms persisted for 1.5 hours.  They are resolved on her arrival in the ED.  She did receive 324 of ASA prior to arrival.  Patient is well-appearing on exam.  Vital signs are reassuring.  EKG on arrival does not show any concerning ST segment abnormalities.  Laboratory workup was initiated.  Initial troponin was 30.  Given her good underlying health, this is higher than expected.  I spoke with cardiologist on-call, who will come evaluate the patient in the ED.  At time of signout, second troponin pending.  Care of patient was signed out to oncoming provider.   Social Determinants of Health:  Has PCP         Final Clinical Impression(s) / ED Diagnoses Final diagnoses:  Chest tightness    Rx / DC Orders ED Discharge Orders     None  Gloris Manchester, MD 08/03/22 [phone]

## 2022-08-03 NOTE — ED Notes (Signed)
Patient transported to CT 

## 2022-08-04 ENCOUNTER — Observation Stay (HOSPITAL_BASED_OUTPATIENT_CLINIC_OR_DEPARTMENT_OTHER): Payer: Medicare HMO

## 2022-08-04 ENCOUNTER — Other Ambulatory Visit: Payer: Self-pay

## 2022-08-04 DIAGNOSIS — I351 Nonrheumatic aortic (valve) insufficiency: Secondary | ICD-10-CM | POA: Insufficient documentation

## 2022-08-04 DIAGNOSIS — R079 Chest pain, unspecified: Secondary | ICD-10-CM | POA: Insufficient documentation

## 2022-08-04 DIAGNOSIS — I2489 Other forms of acute ischemic heart disease: Secondary | ICD-10-CM | POA: Diagnosis not present

## 2022-08-04 DIAGNOSIS — I214 Non-ST elevation (NSTEMI) myocardial infarction: Secondary | ICD-10-CM | POA: Diagnosis not present

## 2022-08-04 DIAGNOSIS — I10 Essential (primary) hypertension: Secondary | ICD-10-CM | POA: Diagnosis not present

## 2022-08-04 DIAGNOSIS — R072 Precordial pain: Secondary | ICD-10-CM

## 2022-08-04 DIAGNOSIS — I77810 Thoracic aortic ectasia: Secondary | ICD-10-CM | POA: Insufficient documentation

## 2022-08-04 LAB — LIPID PANEL
Cholesterol: 197 mg/dL (ref 0–200)
HDL: 61 mg/dL (ref >40)
LDL Cholesterol: 125 mg/dL — ABNORMAL HIGH (ref 0–99)
Total CHOL/HDL Ratio: 3.2 RATIO
Triglycerides: 54 mg/dL (ref <150)
VLDL: 11 mg/dL (ref 0–40)

## 2022-08-04 LAB — CBC
HCT: 38.6 % (ref 36.0–46.0)
Hemoglobin: 13.3 g/dL (ref 12.0–15.0)
MCH: 28.6 pg (ref 26.0–34.0)
MCHC: 34.5 g/dL (ref 30.0–36.0)
MCV: 83 fL (ref 80.0–100.0)
Platelets: 191 10*3/uL (ref 150–400)
RBC: 4.65 MIL/uL (ref 3.87–5.11)
RDW: 14.6 % (ref 11.5–15.5)
WBC: 4.7 10*3/uL (ref 4.0–10.5)
nRBC: 0 % (ref 0.0–0.2)

## 2022-08-04 LAB — ECHOCARDIOGRAM COMPLETE
Area-P 1/2: 3.37 cm2
Height: 62 in
P 1/2 time: 363 msec
S' Lateral: 2.4 cm
Weight: 2240 oz

## 2022-08-04 LAB — TSH: TSH: 1.491 u[IU]/mL (ref 0.350–4.500)

## 2022-08-04 LAB — BASIC METABOLIC PANEL
Anion gap: 9 (ref 5–15)
BUN: 7 mg/dL — ABNORMAL LOW (ref 8–23)
CO2: 23 mmol/L (ref 22–32)
Calcium: 8.8 mg/dL — ABNORMAL LOW (ref 8.9–10.3)
Chloride: 102 mmol/L (ref 98–111)
Creatinine, Ser: 0.69 mg/dL (ref 0.44–1.00)
GFR, Estimated: >60 mL/min (ref >60)
Glucose, Bld: 92 mg/dL (ref 70–99)
Potassium: 3.8 mmol/L (ref 3.5–5.1)
Sodium: 134 mmol/L — ABNORMAL LOW (ref 135–145)

## 2022-08-04 LAB — T4, FREE: Free T4: 0.65 ng/dL (ref 0.61–1.12)

## 2022-08-04 LAB — PROTIME-INR
INR: 1 (ref 0.8–1.2)
Prothrombin Time: 13 seconds (ref 11.4–15.2)

## 2022-08-04 LAB — HEMOGLOBIN A1C
Hgb A1c MFr Bld: 4.9 % (ref 4.8–5.6)
Mean Plasma Glucose: 93.93 mg/dL

## 2022-08-04 LAB — BRAIN NATRIURETIC PEPTIDE: B Natriuretic Peptide: 139.3 pg/mL — ABNORMAL HIGH (ref 0.0–100.0)

## 2022-08-04 MED ORDER — THYROID 30 MG PO TABS: 30.0000 mg | ORAL_TABLET | ORAL | Status: DC

## 2022-08-04 MED ORDER — ROSUVASTATIN CALCIUM 10 MG PO TABS: 10.0000 mg | ORAL_TABLET | Freq: Every day | ORAL | 3 refills | Status: DC

## 2022-08-04 MED ORDER — THYROID 60 MG PO TABS
60.0000 mg | ORAL_TABLET | ORAL | Status: DC
  Administered 2022-08-04: 60 mg via ORAL
  Filled 2022-08-04: qty 1

## 2022-08-04 MED ORDER — NITROGLYCERIN 0.4 MG SL SUBL: 0.4000 mg | SUBLINGUAL_TABLET | SUBLINGUAL | Status: DC | PRN

## 2022-08-04 MED ORDER — NITROGLYCERIN 0.4 MG SL SUBL
SUBLINGUAL_TABLET | SUBLINGUAL | Status: AC
  Filled 2022-08-04: qty 2

## 2022-08-04 MED ORDER — ATORVASTATIN CALCIUM 80 MG PO TABS
80.0000 mg | ORAL_TABLET | Freq: Every day | ORAL | Status: DC
  Administered 2022-08-04: 80 mg via ORAL
  Filled 2022-08-04: qty 1

## 2022-08-04 MED ORDER — CITALOPRAM HYDROBROMIDE 20 MG PO TABS
40.0000 mg | ORAL_TABLET | Freq: Every day | ORAL | Status: DC
  Filled 2022-08-04: qty 4
  Filled 2022-08-04: qty 2

## 2022-08-04 MED ORDER — ONDANSETRON HCL 4 MG/2ML IJ SOLN: 4.0000 mg | Freq: Four times a day (QID) | INTRAMUSCULAR | Status: DC | PRN

## 2022-08-04 MED ORDER — IRBESARTAN 300 MG PO TABS
150.0000 mg | ORAL_TABLET | Freq: Every day | ORAL | Status: DC
  Administered 2022-08-04: 150 mg via ORAL
  Filled 2022-08-04: qty 1

## 2022-08-04 MED ORDER — ACETAMINOPHEN 325 MG PO TABS: 650.0000 mg | ORAL_TABLET | ORAL | Status: DC | PRN

## 2022-08-04 MED ORDER — ASPIRIN 81 MG PO TBEC
81.0000 mg | DELAYED_RELEASE_TABLET | Freq: Every day | ORAL | Status: DC
  Administered 2022-08-04: 81 mg via ORAL
  Filled 2022-08-04: qty 1

## 2022-08-04 MED ORDER — IOHEXOL 350 MG/ML SOLN
95.0000 mL | Freq: Once | INTRAVENOUS | Status: AC | PRN
  Administered 2022-08-04: 95 mL via INTRAVENOUS

## 2022-08-04 MED ORDER — ENOXAPARIN SODIUM 40 MG/0.4ML IJ SOSY
40.0000 mg | PREFILLED_SYRINGE | INTRAMUSCULAR | Status: DC
  Administered 2022-08-04: 40 mg via SUBCUTANEOUS
  Filled 2022-08-04: qty 0.4

## 2022-08-04 NOTE — Progress Notes (Signed)
RN went over discharge instructions with patient at the bedside. RN made patient aware of medication changes and follow up appointments. Patient verbalize understanding. NT+3 removed PIVs. Site is clean dry and intact. Telemonitor removed and CCMD notified. Patient is getting dressed. Transportation has been called.

## 2022-08-04 NOTE — Care Management (Signed)
The Transition of Care Department (TOC) has reviewed patient and no TOC needs have been identified at this time. We will continue to monitor patient advancement through interdisciplinary progression rounds. If new patient transition needs arise, please place a TOC consult °

## 2022-08-04 NOTE — ED Notes (Signed)
ED TO INPATIENT HANDOFF REPORT  ED Nurse Name and Phone #: Vernona Rieger [phone]  S Name/Age/Gender Sheri Ray 77 y.o. female Room/Bed: 044C/044C  Code Status   Code Status: Full Code  Home/SNF/Other Home Patient oriented to: self, place, time, and situation Is this baseline? Yes   Triage Complete: Triage complete  Chief Complaint NSTEMI (non-ST elevated myocardial infarction) [facility]) [I21.4]  Triage Note Pt c/o chest tightness & SHOB after [employer] lessons and felt chest tightness across her chest today around 16:30. Pt reports chest tightness has resolved but still SHOB.EMS picked pt up at Urgent Care.   Allergies Allergies  Allergen Reactions   Latex Itching    Burning, itching, pain   Pineapple Itching    Level of Care/Admitting Diagnosis ED Disposition     ED Disposition  Admit   Condition  --   Comment  Hospital Area: [facility] [[id number]]  Level of Care: Telemetry Cardiac [[id number]]  May place patient in observation at [facility] or [facility] if equivalent level of care is available:: No  Covid Evaluation: Asymptomatic - no recent exposure (last 10 days) testing not required  Diagnosis: NSTEMI (non-ST elevated myocardial infarction) [facility]) [[id number]]  Admitting Physician: Augustin Schooling, Maurine Minister [phone]  Attending Physician: Augustin Schooling, DENNIS [phone]          B Medical/Surgery History Past Medical History:  Diagnosis Date   Allergy    Arthritis    Asthma    Cancer (HCC)    skin   Depression    Thyroid disease    Past Surgical History:  Procedure Laterality Date   ANKLE FRACTURE SURGERY     COLONOSCOPY  01/20/2015   FINGER SURGERY     HAND SURGERY     right hand   TONSILLECTOMY       A IV Location/Drains/Wounds Patient Lines/Drains/Airways Status     Active Line/Drains/Airways     Name Placement date Placement time Site Days   Peripheral IV 08/03/22 18 G Anterior;Distal;Left Forearm 08/03/22  1928  Forearm  1    Peripheral IV 08/04/22 18 G Anterior;Proximal;Right Forearm 08/04/22  0922  Forearm  less than 1            Intake/Output Last 24 hours No intake or output data in the 24 hours ending 08/04/22 1053  Labs/Imaging Results for orders placed or performed during the hospital encounter of 08/03/22 (from the past 48 hour(s))  Troponin I (High Sensitivity)     Status: Abnormal   Collection Time: 08/03/22  8:15 PM  Result Value Ref Range   Troponin I (High Sensitivity) 30 (H) <18 ng/L    Comment: (NOTE) Elevated high sensitivity troponin I (hsTnI) values and significant  changes across serial measurements may suggest ACS but many other  chronic and acute conditions are known to elevate hsTnI results.  Refer to the "Links" section for chest pain algorithms and additional  guidance. Performed at [facility], [address], [place], [place] [address]   Magnesium     Status: None   Collection Time: 08/03/22  8:15 PM  Result Value Ref Range   Magnesium 2.1 1.7 - 2.4 mg/dL    Comment: Performed at [facility], [address] [place], [place] [address]  Lipase, blood     Status: None   Collection Time: 08/03/22  8:15 PM  Result Value Ref Range   Lipase 32 11 - 51 U/L    Comment: Performed at [facility],  [address] [place], [place] [address]  Comprehensive metabolic panel     Status: Abnormal   Collection Time: 08/03/22  8:15 PM  Result Value Ref Range   Sodium 130 (L) 135 - 145 mmol/L   Potassium 3.8 3.5 - 5.1 mmol/L   Chloride 100 98 - 111 mmol/L   CO2 23 22 - 32 mmol/L   Glucose, Bld 85 70 - 99 mg/dL    Comment: Glucose reference range applies only to samples taken after fasting for at least 8 hours.   BUN 8 8 - 23 mg/dL   Creatinine, Ser 6.04 0.44 - 1.00 mg/dL   Calcium 8.7 (L) 8.9 - 10.3 mg/dL   Total Protein 6.0 (L) 6.5 - 8.1 g/dL   Albumin 3.1 (L) 3.5 - 5.0 g/dL   AST 25 15 - 41 U/L   ALT 17 0 - 44 U/L   Alkaline Phosphatase 44 38 - 126 U/L    Total Bilirubin 0.5 0.3 - 1.2 mg/dL   GFR, Estimated >54 >09 mL/min    Comment: (NOTE) Calculated using the CKD-EPI Creatinine Equation (2021)    Anion gap 7 5 - 15    Comment: Performed at [facility], [address] [place], [place] [address]  Brain natriuretic peptide (order if patient c/o SOB ONLY)     Status: None   Collection Time: 08/03/22  8:15 PM  Result Value Ref Range   B Natriuretic Peptide 93.6 0.0 - 100.0 pg/mL    Comment: Performed at [facility], [address], [place], [place] [address]  CBC with Differential/Platelet     Status: Abnormal   Collection Time: 08/03/22  8:15 PM  Result Value Ref Range   WBC 5.4 4.0 - 10.5 K/uL   RBC 4.37 3.87 - 5.11 MIL/uL   Hemoglobin 12.6 12.0 - 15.0 g/dL   HCT 56.2 (L) 13.0 - 86.5 %   MCV 81.7 80.0 - 100.0 fL   MCH 28.8 26.0 - 34.0 pg   MCHC 35.3 30.0 - 36.0 g/dL   RDW 78.4 69.6 - 29.5 %   Platelets 169 150 - 400 K/uL   nRBC 0.0 0.0 - 0.2 %   Neutrophils Relative % 72 %   Neutro Abs 3.9 1.7 - 7.7 K/uL   Lymphocytes Relative 19 %   Lymphs Abs 1.1 0.7 - 4.0 K/uL   Monocytes Relative 6 %   Monocytes Absolute 0.3 0.1 - 1.0 K/uL   Eosinophils Relative 2 %   Eosinophils Absolute 0.1 0.0 - 0.5 K/uL   Basophils Relative 1 %   Basophils Absolute 0.0 0.0 - 0.1 K/uL   Immature Granulocytes 0 %   Abs Immature Granulocytes 0.01 0.00 - 0.07 K/uL    Comment: Performed at [facility], [address] [place], [place] [address]  Troponin I (High Sensitivity)     Status: Abnormal   Collection Time: 08/03/22 10:20 PM  Result Value Ref Range   Troponin I (High Sensitivity) 39 (H) <18 ng/L    Comment: (NOTE) Elevated high sensitivity troponin I (hsTnI) values and significant  changes across serial measurements may suggest ACS but many other  chronic and acute conditions are known to elevate hsTnI results.  Refer to the "Links" section for chest pain algorithms and additional  guidance. Performed at [facility], [address], [place], [place] [address]   T4, free     Status: None   Collection Time: 08/04/22  5:12 AM  Result Value Ref Range   Free T4 0.65 0.61 - 1.12 ng/dL    Comment: (NOTE) Biotin ingestion may interfere with free T4 tests. If the results are inconsistent with the TSH level, previous test results, or the clinical presentation, then consider biotin interference. If needed, order repeat testing after stopping biotin. Performed at [facility], [address], [place], [place] [address]   Hemoglobin A1c     Status: None   Collection Time: 08/04/22  5:12 AM  Result Value Ref Range   Hgb A1c MFr Bld 4.9 4.8 - 5.6 %    Comment: (NOTE) Pre diabetes:          5.7%-6.4%  Diabetes:              >6.4%  Glycemic control for   <7.0% adults with diabetes    Mean Plasma Glucose 93.93 mg/dL    Comment: Performed at [facility], [address], [place], [place] [address]  Brain natriuretic peptide     Status: Abnormal   Collection Time: 08/04/22  5:12 AM  Result Value Ref Range   B Natriuretic Peptide 139.3 (H) 0.0 - 100.0 pg/mL    Comment: Performed at [facility], [address], [place], [place] [address]  Basic metabolic panel     Status: Abnormal   Collection Time: 08/04/22  5:12 AM  Result Value Ref Range   Sodium 134 (L) 135 - 145 mmol/L   Potassium 3.8 3.5 - 5.1 mmol/L   Chloride 102 98 - 111 mmol/L   CO2 23 22 - 32 mmol/L   Glucose, Bld 92 70 - 99 mg/dL    Comment: Glucose reference range applies only to samples taken after fasting for at least 8 hours.   BUN 7 (L) 8 - 23 mg/dL   Creatinine, Ser 9.14 0.44 - 1.00 mg/dL   Calcium 8.8 (L) 8.9 - 10.3 mg/dL   GFR, Estimated >78 >29 mL/min    Comment: (NOTE) Calculated using the CKD-EPI Creatinine Equation (2021)    Anion gap 9 5 - 15    Comment: Performed at [facility], [address], [place], [place] [address]  CBC     Status: None   Collection Time: 08/04/22  5:12 AM  Result  Value Ref Range   WBC 4.7 4.0 - 10.5 K/uL   RBC 4.65 3.87 - 5.11 MIL/uL   Hemoglobin 13.3 12.0 - 15.0 g/dL   HCT 08.6 57.8 - 46.9 %   MCV 83.0 80.0 - 100.0 fL   MCH 28.6 26.0 - 34.0 pg   MCHC 34.5 30.0 - 36.0 g/dL   RDW 62.9 52.8 - 41.3 %   Platelets 191 150 - 400 K/uL   nRBC 0.0 0.0 - 0.2 %    Comment: Performed at [facility], [address] [place], [place] [address]  Protime-INR     Status: None   Collection Time: 08/04/22  5:12 AM  Result Value Ref Range   Prothrombin Time 13.0 11.4 - 15.2 seconds   INR 1.0 0.8 - 1.2    Comment: (NOTE) INR goal varies based on device and disease states. Performed at [facility], [address] [place], [place] [address]   Lipid panel     Status: Abnormal   Collection Time: 08/04/22  5:12 AM  Result Value Ref Range   Cholesterol 197 0 - 200 mg/dL   Triglycerides 54 <366 mg/dL   HDL 61 >  40 mg/dL   Total CHOL/HDL Ratio 3.2 RATIO   VLDL 11 0 - 40 mg/dL   LDL Cholesterol 409 (H) 0 - 99 mg/dL    Comment:        Total Cholesterol/HDL:CHD Risk Coronary Heart Disease Risk Table                     Men   Women  1/2 Average Risk   3.4   3.3  Average Risk       5.0   4.4  2 X Average Risk   9.6   7.1  3 X Average Risk  23.4   11.0        Use the calculated Patient Ratio above and the CHD Risk Table to determine the patient's CHD Risk.        ATP III CLASSIFICATION (LDL):  <100     mg/dL   Optimal  811-914  mg/dL   Near or Above                    Optimal  130-159  mg/dL   Borderline  782-956  mg/dL   High  >213     mg/dL   Very High Performed at [facility], [address] [place], [place] [address]   TSH     Status: None   Collection Time: 08/04/22  5:12 AM  Result Value Ref Range   TSH 1.491 0.350 - 4.500 uIU/mL    Comment: Performed by a 3rd Generation assay with a functional sensitivity of <=0.01 uIU/mL. Performed at [facility], [address] [place] [address], [place], [place] [address]    CT Angio Chest PE W  and/or Wo Contrast  Result Date: 08/03/2022 CLINICAL DATA:  Concern for palmar thumb. EXAM: CT ANGIOGRAPHY CHEST WITH CONTRAST TECHNIQUE: Multidetector CT imaging of the chest was performed using the standard protocol during bolus administration of intravenous contrast. Multiplanar CT image reconstructions and MIPs were obtained to evaluate the vascular anatomy. RADIATION DOSE REDUCTION: This exam was performed according to the departmental dose-optimization program which includes automated exposure control, adjustment of the mA and/or kV according to patient size and/or use of iterative reconstruction technique. CONTRAST:  75mL OMNIPAQUE IOHEXOL 350 MG/ML SOLN COMPARISON:  Chest radiograph dated 08/03/2022. FINDINGS: Cardiovascular: Mild cardiomegaly. No pericardial effusion. There is mild atherosclerotic calcification of the thoracic aorta. Top-normal ascending aorta measure up to 4 cm in diameter. The origins of the great vessels of the aortic arch appear patent. No pulmonary artery embolus identified. Mediastinum/Nodes: No hilar or mediastinal adenopathy. The esophagus is grossly unremarkable. No mediastinal fluid collection. Lungs/Pleura: There is a 1.4 cm nodule in the right middle lobe. There is a 6 mm nodule versus a para fissural lymph node along the left fissure (62/6). Minimal bibasilar atelectasis. No focal consolidation, pleural effusion, or pneumothorax. The central airways are patent. Upper Abdomen: No acute abnormality. Musculoskeletal: Osteopenia with degenerative changes of the spine. No acute osseous pathology. Review of the MIP images confirms the above findings. IMPRESSION: 1. No acute intrathoracic pathology. No pulmonary artery embolus identified. 2. A 1.4 cm right middle lobe nodule. Consider one of the following in 3 months for both low-risk and high-risk individuals: (a) repeat chest CT, (b) follow-up PET-CT, or (c) tissue sampling. This recommendation follows the consensus statement:  Guidelines for Management of Incidental Pulmonary Nodules Detected on CT Images: From the Fleischner Society 2017; Radiology 2017; 284:228-243. 3.  Aortic Atherosclerosis (ICD10-I70.0). Electronically Signed  By: Elgie Collard M.D.   On: 08/03/2022 23:00   DG Chest Portable 1 View  Result Date: 08/03/2022 CLINICAL DATA:  Chest tightness and shortness of breath. EXAM: PORTABLE CHEST 1 VIEW COMPARISON:  Chest radiograph 12/26/2017 FINDINGS: Heart is upper normal in size. Stable mediastinal contours. Subsegmental atelectasis or scarring in the left mid lower lung zone. There is a 9 mm nodular density projecting over the right lower lung zone. Normal pulmonary vasculature. No significant pleural effusion. No pneumothorax. IMPRESSION: 1. Subsegmental atelectasis or scarring in the left mid lower lung zone. 2. A 9 mm nodular density projecting over the right lower lung zone. This is higher than would be expected for a nipple shadow. Recommend further evaluation with chest CT (could be performed on an elective basis). Electronically Signed   By: Narda Rutherford M.D.   On: 08/03/2022 20:26    Pending Labs Unresulted Labs (From admission, onward)     Start     Ordered   08/04/22 0500  Lipoprotein A (LPA)  Tomorrow morning,   R        08/04/22 0235            Vitals/Pain Today's Vitals   08/04/22 0856 08/04/22 0900 08/04/22 1008 08/04/22 1051  BP:  130/73  137/77  Pulse:  65  71  Resp:  19  18  Temp:   98 F (36.7 C) 98.4 F (36.9 C)  TempSrc:    Oral  SpO2:  99%    Weight:      Height:      PainSc: 0-No pain   0-No pain    Isolation Precautions No active isolations  Medications Medications  irbesartan (AVAPRO) tablet 150 mg (has no administration in time range)  citalopram (CELEXA) tablet 40 mg (has no administration in time range)  aspirin EC tablet 81 mg (has no administration in time range)  nitroGLYCERIN (NITROSTAT) SL tablet 0.4 mg (has no administration in time range)   acetaminophen (TYLENOL) tablet 650 mg (has no administration in time range)  ondansetron (ZOFRAN) injection 4 mg (has no administration in time range)  enoxaparin (LOVENOX) injection 40 mg (has no administration in time range)  atorvastatin (LIPITOR) tablet 80 mg (has no administration in time range)  thyroid (ARMOUR) tablet 30 mg (has no administration in time range)  thyroid (ARMOUR) tablet 60 mg (60 mg Oral Given 08/04/22 0622)  iohexol (OMNIPAQUE) 350 MG/ML injection 75 mL (75 mLs Intravenous Contrast Given 08/03/22 2243)  iohexol (OMNIPAQUE) 350 MG/ML injection 95 mL (95 mLs Intravenous Contrast Given 08/04/22 1007)    Mobility walks     Focused Assessments Cardiac Assessment Handoff:    Lab Results  Component Value Date   CKTOTAL 61 09/14/2014   TROPONINI <0.03 09/14/2014   No results found for: "DDIMER" Does the Patient currently have chest pain? No    R Recommendations: See Admitting Provider Note  Report given to:   Additional Notes: Ambulates with a safe, steady gait. Continent.

## 2022-08-04 NOTE — H&P (Signed)
Cardiology Admission History and Physical   Patient ID: Sheri Ray MRN: [id number]; DOB: 10-19-1945   Admission date: 08/03/2022  PCP:  Lupita Raider, MD   [facility] Providers Cardiologist:  None        Chief Complaint:  chest pain  Patient Profile:   Sheri Ray is a 77 y.o. female with hypertension, hyperlipidemia, depression, asthma who is being seen 08/04/2022 for the evaluation of chest pain.  History of Present Illness:   Sheri Ray 77 year old lady with hypertension hyperlipidemia who does not have a history of coronary disease began having typical angina symptoms yesterday which prompted her to go to the urgent care office.  At urgent care office she was still having chest pain symptoms so EMS was called where she received aspirin and transferred to [facility] emergency department.  On the way here her chest pain subsided and she is no longer having any chest pain.  She notes that she was not having any other symptoms at this time.  She does note that she is still having some ongoing fatigue.  She has a family history of coronary disease with a father who died of an MI.  On arrival to the emergency department workup was unremarkable except for troponin that was 30>> 39   Past Medical History:  Diagnosis Date   Allergy    Arthritis    Asthma    Cancer (HCC)    skin   Depression    Thyroid disease     Past Surgical History:  Procedure Laterality Date   ANKLE FRACTURE SURGERY     COLONOSCOPY  01/20/2015   FINGER SURGERY     HAND SURGERY     right hand   TONSILLECTOMY       Medications Prior to Admission: Prior to Admission medications   Medication Sig Start Date End Date Taking? Authorizing Provider  acetaminophen (TYLENOL) 500 MG tablet Take 500 mg by mouth every 6 (six) hours as needed for mild pain.   Yes [provider]  Calcium-Vitamin D 600-200 MG-UNIT per tablet Take 1 tablet by mouth 2 (two) times daily.   Yes  [provider]  citalopram (CELEXA) 40 MG tablet Take 40 mg by mouth daily. 05/17/20  Yes [provider]  Cyanocobalamin (VITAMIN B 12) 500 MCG TABS Take 500 mcg by mouth daily.   Yes [provider]  diclofenac (VOLTAREN) 50 MG EC tablet TAKE 1 TABLET BY MOUTH TWICE A DAY Patient taking differently: Take 50 mg by mouth 2 (two) times daily. 08/17/20  Yes Felecia Shelling, DPM  Garlic 2000 MG TBEC Take 2,000 mg by mouth daily.   Yes [provider]  Ginkgo Biloba 60 MG CAPS Take 60 mg by mouth daily.   Yes [provider]  Magnesium 250 MG TABS Take 250 mg by mouth daily.   Yes [provider]  olmesartan (BENICAR) 20 MG tablet Take 20 mg by mouth daily. 06/22/22  Yes [provider]  Omega-3 Fatty Acids (FISH OIL) 1200 MG CAPS Take 1,200 mg by mouth daily.   Yes [provider]  PREMARIN vaginal cream Place 1 applicator vaginally once a week. 05/20/21  Yes [provider]  thyroid (ARMOUR THYROID) 60 MG tablet Take 30-60 mg by mouth See admin instructions. Take 1/2 tablet by mouth every Monday and 1 tablet all other days   Yes [provider]  Turmeric 500 MG CAPS Take 500 mg by mouth daily.   Yes  [provider]  zinc gluconate 50 MG tablet Take 50 mg by mouth daily.   Yes [provider]     Allergies:    Allergies  Allergen Reactions   Latex Itching    Burning, itching, pain   Pineapple Itching    Social History:   Social History   Socioeconomic History   Marital status: Legally Separated    Spouse name: Not on file   Number of children: Not on file   Years of education: Not on file   Highest education level: Not on file  Occupational History   Not on file  Tobacco Use   Smoking status: Never   Smokeless tobacco: Never  Vaping Use   Vaping Use: Never used  Substance and Sexual Activity   Alcohol use: Yes    Alcohol/week: 0.0 standard drinks of alcohol    Comment: occ.  wine per pt   Drug use: No   Sexual activity: Not on file  Other Topics Concern   Not on file  Social History Narrative   Not on file   Social Determinants of Health   Financial Resource Strain: Not on file  Food Insecurity: Not on file  Transportation Needs: Not on file  Physical Activity: Not on file  Stress: Not on file  Social Connections: Not on file  Intimate Partner Violence: Not on file    Family History:   The patient's family history is negative for Colon cancer, Colon polyps, Esophageal cancer, Rectal cancer, and Stomach cancer.    ROS:  Please see the history of present illness.  All other ROS reviewed and negative.     Physical Exam/Data:   Vitals:   08/04/22 0345 08/04/22 0400 08/04/22 0415 08/04/22 0430  BP: 107/69  (!) 101/58 101/63  Pulse:  (!) 56  (!) 55  Resp:  16  14  Temp:      TempSrc:      SpO2:  97%  95%  Weight:      Height:       No intake or output data in the 24 hours ending 08/04/22 0557    08/03/2022    7:42 PM 03/12/2018    8:24 AM 02/26/2018   12:59 PM  Last 3 Weights  Weight (lbs) 140 lb 140 lb 142 lb  Weight (kg) 63.504 kg 63.504 kg 64.411 kg     Body mass index is 25.61 kg/m.  General:  in no acute distress HEENT: normal Neck: no JVD Vascular: No carotid bruits; Distal pulses 2+ bilaterally   Cardiac:  normal S1, S2; RRR; no murmur  Lungs:  clear to auscultation bilaterally, no wheezing, rhonchi or rales  Abd: soft, nontender, no hepatomegaly  Ext: no edema Musculoskeletal:  No deformities, BUE and BLE strength normal and equal Skin: warm and dry  Neuro:  CNs 2-12 intact, no focal abnormalities noted Psych:  Normal affect    EKG:  The ECG that was done  was personally reviewed and demonstrates normal sinus rhythm no ST or T wave changes  Relevant CV Studies: None  Laboratory Data:  High Sensitivity Troponin:   Recent Labs  Lab 08/03/22 2015 08/03/22 2220  TROPONINIHS 30* 39*      Chemistry Recent Labs  Lab  08/03/22 2015  NA 130*  K 3.8  CL 100  CO2 23  GLUCOSE 85  BUN 8  CREATININE 0.81  CALCIUM 8.7*  MG 2.1  GFRNONAA >60  ANIONGAP 7    Recent Labs  Lab 08/03/22 2015  PROT 6.0*  ALBUMIN 3.1*  AST 25  ALT 17  ALKPHOS 44  BILITOT 0.5   Lipids No results for input(s): "CHOL", "TRIG", "HDL", "LABVLDL", "LDLCALC", "CHOLHDL" in the last 168 hours. Hematology Recent Labs  Lab 08/03/22 2015  WBC 5.4  RBC 4.37  HGB 12.6  HCT 35.7*  MCV 81.7  MCH 28.8  MCHC 35.3  RDW 14.2  PLT 169   Thyroid No results for input(s): "TSH", "FREET4" in the last 168 hours. BNP Recent Labs  Lab 08/03/22 2015  BNP 93.6    DDimer No results for input(s): "DDIMER" in the last 168 hours.   Radiology/Studies:  CT Angio Chest PE W and/or Wo Contrast  Result Date: 08/03/2022 CLINICAL DATA:  Concern for palmar thumb. EXAM: CT ANGIOGRAPHY CHEST WITH CONTRAST TECHNIQUE: Multidetector CT imaging of the chest was performed using the standard protocol during bolus administration of intravenous contrast. Multiplanar CT image reconstructions and MIPs were obtained to evaluate the vascular anatomy. RADIATION DOSE REDUCTION: This exam was performed according to the departmental dose-optimization program which includes automated exposure control, adjustment of the mA and/or kV according to patient size and/or use of iterative reconstruction technique. CONTRAST:  75mL OMNIPAQUE IOHEXOL 350 MG/ML SOLN COMPARISON:  Chest radiograph dated 08/03/2022. FINDINGS: Cardiovascular: Mild cardiomegaly. No pericardial effusion. There is mild atherosclerotic calcification of the thoracic aorta. Top-normal ascending aorta measure up to 4 cm in diameter. The origins of the great vessels of the aortic arch appear patent. No pulmonary artery embolus identified. Mediastinum/Nodes: No hilar or mediastinal adenopathy. The esophagus is grossly unremarkable. No mediastinal fluid collection. Lungs/Pleura: There is a 1.4 cm nodule in the  right middle lobe. There is a 6 mm nodule versus a para fissural lymph node along the left fissure (62/6). Minimal bibasilar atelectasis. No focal consolidation, pleural effusion, or pneumothorax. The central airways are patent. Upper Abdomen: No acute abnormality. Musculoskeletal: Osteopenia with degenerative changes of the spine. No acute osseous pathology. Review of the MIP images confirms the above findings. IMPRESSION: 1. No acute intrathoracic pathology. No pulmonary artery embolus identified. 2. A 1.4 cm right middle lobe nodule. Consider one of the following in 3 months for both low-risk and high-risk individuals: (a) repeat chest CT, (b) follow-up PET-CT, or (c) tissue sampling. This recommendation follows the consensus statement: Guidelines for Management of Incidental Pulmonary Nodules Detected on CT Images: From the Fleischner Society 2017; Radiology 2017; 284:228-243. 3.  Aortic Atherosclerosis (ICD10-I70.0). Electronically Signed   By: Elgie Collard M.D.   On: 08/03/2022 23:00   DG Chest Portable 1 View  Result Date: 08/03/2022 CLINICAL DATA:  Chest tightness and shortness of breath. EXAM: PORTABLE CHEST 1 VIEW COMPARISON:  Chest radiograph 12/26/2017 FINDINGS: Heart is upper normal in size. Stable mediastinal contours. Subsegmental atelectasis or scarring in the left mid lower lung zone. There is a 9 mm nodular density projecting over the right lower lung zone. Normal pulmonary vasculature. No significant pleural effusion. No pneumothorax. IMPRESSION: 1. Subsegmental atelectasis or scarring in the left mid lower lung zone. 2. A 9 mm nodular density projecting over the right lower lung zone. This is higher than would be expected for a nipple shadow. Recommend further evaluation with chest CT (could be performed on an elective basis). Electronically Signed   By: Narda Rutherford M.D.   On: 08/03/2022 20:26     Assessment and Plan:   NSTEMI She is presenting with typical angina and a very  small troponin leak of 30-39.  However now she is completely chest pain-free and has not had any recurrence since being in the hospital.  She has no history of coronary disease.  I think given that she is chest pain-free and had a very small troponin leak along with the fact that she is hesitant to undergo procedures and it took me a little bit to convince her to stay for an observation, we should start with a chest CT coronary angiography to see if she has patent blood flow in her coronary arteries.  Will also get an echo for evaluation.  She agrees with this plan.  -Chest CT coronary angiography ordered -Echo ordered -Continue aspirin 81 mg and start atorvastatin 80 mg -Check lipid panel, LP(a), hemoglobin A1c, TSH, free T4, CMP   2.  Hypertension She is on Benicar at home, ordered irbesartan 150 mg which is on formulary here  3.  Hypothyroidism Continue home dosing of Synthroid  4.  Depression Continue citalopram   Risk Assessment/Risk Scores:    TIMI Risk Score for Unstable Angina or Non-ST Elevation MI:   The patient's TIMI risk score is 2, which indicates a 8% risk of all cause mortality, new or recurrent myocardial infarction or need for urgent revascularization in the next 14 days.     Severity of Illness: The appropriate patient status for this patient is OBSERVATION. Observation status is judged to be reasonable and necessary in order to provide the required intensity of service to ensure the patient's safety. The patient's presenting symptoms, physical exam findings, and initial radiographic and laboratory data in the context of their medical condition is felt to place them at decreased risk for further clinical deterioration. Furthermore, it is anticipated that the patient will be medically stable for discharge from the hospital within 2 midnights of admission.    For questions or updates, please contact [facility] Please consult [email or web address] for contact info  under     Signed, Joellen Jersey, MD  08/04/2022 5:57 AM

## 2022-08-04 NOTE — Progress Notes (Signed)
  Echocardiogram 2D Echocardiogram has been performed.  Sheri Ray 08/04/2022, 11:43 AM

## 2022-08-04 NOTE — ED Notes (Signed)
Pt going from vascular to her room on 3

## 2022-08-04 NOTE — Discharge Summary (Addendum)
Discharge Summary    Patient ID: Sheri Ray MRN: [id number]; DOB: September 08, 1945  Admit date: 08/03/2022 Discharge date: 08/04/2022  PCP:  Lupita Raider, MD    [facility] Providers Cardiologist:  Meriam Sprague, MD   Discharge Diagnoses    Principal Problem:   NSTEMI (non-ST elevated myocardial infarction) [facility]) Active Problems:   Hyperlipidemia LDL goal <100   Chest pain of uncertain etiology   Essential hypertension   Aortic insufficiency   Ascending aorta dilatation [facility])    Diagnostic Studies/Procedures    CT coronary 08/04/22: IMPRESSION: 1. Coronary calcium score of 0. This was 1st percentile for age, sex, and race matched control.   2. Normal coronary origin with right dominance.   3. CAD-RADS 0. No evidence of CAD (0%). Consider non-atherosclerotic causes of chest pain. _____________   Echo   08/04/22 Final read pending   History of Present Illness     Sheri Ray is a 77 y.o. female with HTN, HLD, depression, and asthma presented to the ER 08/04/22 for chest pain.   Ms. Duerst 77 year old lady with hypertension hyperlipidemia who does not have a history of coronary disease began having typical angina symptoms yesterday which prompted her to go to the urgent care office.  At urgent care office she was still having chest pain symptoms so EMS was called where she received aspirin and transferred to [facility] emergency department.  On the way here her chest pain subsided and she is no longer having any chest pain.  She notes that she was not having any other symptoms at this time.  She does note that she is still having some ongoing fatigue.  She has a family history of coronary disease with a father who died of an MI.   On arrival to the emergency department workup was unremarkable except for troponin that was 30>> 39  Hospital Course     Consultants: none  Chest pain CT coronary with a calcium score of zero, no obstructive  disease. Suspect chest pain is noncardiac - occurred after she carried something heavy.  Did not start ASA with coronary calcium score of zero and concurrent diclofenac.    Moderate aortic valve insufficiency Echo with moderate AI  Repeat an echo in 6-12 months   Dilated thoracic aortic aneurysm Ascending aorta 39 mm on CTA Control BP Await final read on echo to compare measurements Planning to repeat echo in 6-12 months   Hypertension BP controlled. Continue home ARB.   Hyperlipidemia with LDL goal < 100 08/04/2022: Cholesterol 197; HDL 61; LDL Cholesterol 125; Triglycerides 54; VLDL 11 Start 10 mg crestor Can be followed by PCP   Pt was seen and examined by Dr. Shari Prows and deemed stable for discharge. Follow up has been arranged.      Did the patient have an acute coronary syndrome (MI, NSTEMI, STEMI, etc) this admission?:  No                               Did the patient have a percutaneous coronary intervention (stent / angioplasty)?:  No.          _____________  Discharge Vitals Blood pressure 122/67, pulse 66, temperature 97.8 F (36.6 C), temperature source Oral, resp. rate 18, height 5\' 2"  (1.575 m), weight 64.1 kg, SpO2 99 %.  Filed Weights   08/03/22 1942 08/04/22 1220  Weight: 63.5 kg 64.1 kg    Labs & Radiologic Studies  CBC Recent Labs    08/03/22 2015 08/04/22 0512  WBC 5.4 4.7  NEUTROABS 3.9  --   HGB 12.6 13.3  HCT 35.7* 38.6  MCV 81.7 83.0  PLT 169 191   Basic Metabolic Panel Recent Labs    16/10/96 2015 08/04/22 0512  NA 130* 134*  K 3.8 3.8  CL 100 102  CO2 23 23  GLUCOSE 85 92  BUN 8 7*  CREATININE 0.81 0.69  CALCIUM 8.7* 8.8*  MG 2.1  --    Liver Function Tests Recent Labs    08/03/22 2015  AST 25  ALT 17  ALKPHOS 44  BILITOT 0.5  PROT 6.0*  ALBUMIN 3.1*   Recent Labs    08/03/22 2015  LIPASE 32   High Sensitivity Troponin:   Recent Labs  Lab 08/03/22 2015 08/03/22 2220  TROPONINIHS 30* 39*     BNP Invalid input(s): "POCBNP" D-Dimer No results for input(s): "DDIMER" in the last 72 hours. Hemoglobin A1C Recent Labs    08/04/22 0512  HGBA1C 4.9   Fasting Lipid Panel Recent Labs    08/04/22 0512  CHOL 197  HDL 61  LDLCALC 125*  TRIG 54  CHOLHDL 3.2   Thyroid Function Tests Recent Labs    08/04/22 0512  TSH 1.491   _____________  CT CORONARY MORPH W/CTA COR W/SCORE W/CA W/CM &/OR WO/CM  Addendum Date: 08/04/2022   ADDENDUM REPORT: 08/04/2022 13:05 EXAM: OVER-READ INTERPRETATION  CT CHEST The following report is an over-read performed by radiologist Dr. [facility] on 08/04/2022. This over-read does not include interpretation of cardiac or coronary anatomy or pathology. The coronary CTA interpretation by the cardiologist is attached. COMPARISON:  CT scan 08/03/2022 FINDINGS: Fusiform aneurysmal dilatation of the ascending thoracic aorta with maximum measurement of 4.0 cm. Recommend annual imaging followup by CTA or MRA. This recommendation follows 2010 ACCF/AHA/AATS/ACR/ASA/SCA/SCAI/SIR/STS/SVM Guidelines for the Diagnosis and Management of Patients with Thoracic Aortic Disease. Circulation. 2010; 121: E454-U981. Aortic aneurysm NOS (ICD10-I71.9). No dissection. No mediastinal or hilar mass or lymphadenopathy. The esophagus is grossly normal. There is a small hiatal hernia noted. Patchy areas of subsegmental atelectasis but no infiltrates or effusions. As demonstrated on the chest CT from yesterday there is a 11 mm nodule in the right middle lobe. No other pulmonary lesions. No significant upper abdominal findings. No significant bony findings. IMPRESSION: Fusiform aneurysmal dilatation of the ascending thoracic aorta with maximum measurement of 4.0 cm. 11 mm right middle lobe nodule as demonstrated on CT scan from yesterday. Recommend close surveillance as previously outlined. Small hiatal hernia. Electronically Signed   By: Rudie Meyer M.D.   On:  08/04/2022 13:05   Result Date: 08/04/2022 CLINICAL DATA:  77 Year-old Female EXAM: Cardiac/Coronary  CTA TECHNIQUE: The patient was scanned on a [employer]. FINDINGS: Scan was triggered in the descending thoracic aorta. Axial non-contrast 3 mm slices were carried out through the heart. The data set was analyzed on a dedicated work station and scored using the Agatson method. Gantry rotation speed was 250 msecs and collimation was .6 mm. 0.8 mg of sl NTG was given. The 3D data set was reconstructed in 5% intervals of the 67-82 % of the R-R cycle. Diastolic phases were analyzed on a dedicated work station using MPR, MIP and VRT modes. The patient received 95 cc of contrast. Coronary Arteries:  Normal coronary origin.  Right dominance. Coronary Calcium Score: Left main: 0 Left anterior descending artery: 0 Left circumflex artery:  0 Right coronary artery: 0 Ramus intermedius artery: 0 Total: 0 Percentile: 1st for age, sex, and race matched control. Plaque Analysis: No plaque, not performed RCA is a large dominant artery that gives rise to PDA and PLA. There is no significant plaque. Left main is a large artery that gives rise to LAD, RI, and LCX arteries. There is no significant plaque. LAD is a large vessel that gives rise to one large D1 Branch and a large septal perforator. There is no significant plaque. LCX is a non-dominant artery.  There is no significant plaque. There is a ramus intermedius vessel with no significant plaque. Other findings: Aorta: Normal size- 39 mm maximal diameter. No calcifications. No dissection. Main Pulmonary Artery: Normal size of the pulmonary artery. Systemic Veins: Normal drainage Aortic Valve:  Tri-leaflet.  Calcium score 71 Mitral valve: No calcifications. Normal pulmonary vein drainage into the left atrium. Normal left atrial appendage without a thrombus. Chicken wing morphology. Left to right shunting noted; slab artifact through the defect. Most consistent with PFO  but not well visualized. Left Ventricle: Normal size with moderate concentric hypertrophy Left Atrium: Normal size Right Ventricle: Normal size Right Atrium: Normal size Pericardium: Normal thickness Extra-cardiac findings: See attached radiology report for non-cardiac structures. Artifact: Slab artifact IMPRESSION: 1. Coronary calcium score of 0. This was 1st percentile for age, sex, and race matched control. 2. Normal coronary origin with right dominance. 3. CAD-RADS 0. No evidence of CAD (0%). Consider non-atherosclerotic causes of chest pain. RECOMMENDATIONS: RECOMMENDATIONS The proposed cut-off value of 1,651 AU yielded a 93 % sensitivity and 75 % specificity in grading AS severity in patients with classical low-flow, low-gradient AS. Proposed different cut-off values to define severe AS for men and women as 2,065 AU and 1,274 AU, respectively. The joint European and American recommendations for the assessment of AS consider the aortic valve calcium score as a continuum - a very high calcium score suggests severe AS and a low calcium score suggests severe AS is unlikely. Sunday Shams, et al. 2017 ESC/EACTS Guidelines for the management of valvular heart disease. Eur Heart J 2017;38:2739-91. Coronary artery calcium (CAC) score is a strong predictor of incident coronary heart disease (CHD) and provides predictive information beyond traditional risk factors. CAC scoring is reasonable to use in the decision to withhold, postpone, or initiate statin therapy in intermediate-risk or selected borderline-risk asymptomatic adults (age 72-75 years and LDL-C >=70 to <190 mg/dL) who do not have diabetes or established atherosclerotic cardiovascular disease (ASCVD).* In intermediate-risk (10-year ASCVD risk >=7.5% to <20%) adults or selected borderline-risk (10-year ASCVD risk >=5% to <7.5%) adults in whom a CAC score is measured for the purpose of making a treatment decision the following recommendations  have been made: If CAC = 0, it is reasonable to withhold statin therapy and reassess in 5 to 10 years, as long as higher risk conditions are absent (diabetes mellitus, family history of premature CHD in first degree relatives (males <55 years; females <65 years), cigarette smoking, LDL >=190 mg/dL or other independent risk factors). If CAC is 1 to 99, it is reasonable to initiate statin therapy for patients >=6 years of age. If CAC is >=100 or >=75th percentile, it is reasonable to initiate statin therapy at any age. Cardiology referral should be considered for patients with CAC scores =400 or >=75th percentile. *2018 AHA/ACC/AACVPR/AAPA/ABC/ACPM/ADA/AGS/APhA/ASPC/NLA/PCNA Guideline on the Management of Blood Cholesterol: A Report of the [employer] of Cardiology/American Heart Association Task Force on Clinical Practice  Guidelines. J Am Coll Cardiol. 2019;73(24):3168-3209. Riley Lam, MD Electronically Signed: By: Riley Lam M.D. On: 08/04/2022 12:13   CT Angio Chest PE W and/or Wo Contrast  Result Date: 08/03/2022 CLINICAL DATA:  Concern for palmar thumb. EXAM: CT ANGIOGRAPHY CHEST WITH CONTRAST TECHNIQUE: Multidetector CT imaging of the chest was performed using the standard protocol during bolus administration of intravenous contrast. Multiplanar CT image reconstructions and MIPs were obtained to evaluate the vascular anatomy. RADIATION DOSE REDUCTION: This exam was performed according to the departmental dose-optimization program which includes automated exposure control, adjustment of the mA and/or kV according to patient size and/or use of iterative reconstruction technique. CONTRAST:  75mL OMNIPAQUE IOHEXOL 350 MG/ML SOLN COMPARISON:  Chest radiograph dated 08/03/2022. FINDINGS: Cardiovascular: Mild cardiomegaly. No pericardial effusion. There is mild atherosclerotic calcification of the thoracic aorta. Top-normal ascending aorta measure up to 4 cm in diameter. The origins of  the great vessels of the aortic arch appear patent. No pulmonary artery embolus identified. Mediastinum/Nodes: No hilar or mediastinal adenopathy. The esophagus is grossly unremarkable. No mediastinal fluid collection. Lungs/Pleura: There is a 1.4 cm nodule in the right middle lobe. There is a 6 mm nodule versus a para fissural lymph node along the left fissure (62/6). Minimal bibasilar atelectasis. No focal consolidation, pleural effusion, or pneumothorax. The central airways are patent. Upper Abdomen: No acute abnormality. Musculoskeletal: Osteopenia with degenerative changes of the spine. No acute osseous pathology. Review of the MIP images confirms the above findings. IMPRESSION: 1. No acute intrathoracic pathology. No pulmonary artery embolus identified. 2. A 1.4 cm right middle lobe nodule. Consider one of the following in 3 months for both low-risk and high-risk individuals: (a) repeat chest CT, (b) follow-up PET-CT, or (c) tissue sampling. This recommendation follows the consensus statement: Guidelines for Management of Incidental Pulmonary Nodules Detected on CT Images: From the Fleischner Society 2017; Radiology 2017; 284:228-243. 3.  Aortic Atherosclerosis (ICD10-I70.0). Electronically Signed   By: Elgie Collard M.D.   On: 08/03/2022 23:00   DG Chest Portable 1 View  Result Date: 08/03/2022 CLINICAL DATA:  Chest tightness and shortness of breath. EXAM: PORTABLE CHEST 1 VIEW COMPARISON:  Chest radiograph 12/26/2017 FINDINGS: Heart is upper normal in size. Stable mediastinal contours. Subsegmental atelectasis or scarring in the left mid lower lung zone. There is a 9 mm nodular density projecting over the right lower lung zone. Normal pulmonary vasculature. No significant pleural effusion. No pneumothorax. IMPRESSION: 1. Subsegmental atelectasis or scarring in the left mid lower lung zone. 2. A 9 mm nodular density projecting over the right lower lung zone. This is higher than would be expected for a  nipple shadow. Recommend further evaluation with chest CT (could be performed on an elective basis). Electronically Signed   By: Narda Rutherford M.D.   On: 08/03/2022 20:26   Disposition   Pt is being discharged home today in good condition.  Follow-up Plans & Appointments     Follow-up Information     Beatrice Lecher, PA-C Follow up on 08/17/2022.   Specialties: Cardiology, Physician Assistant Why: 8:00 AM Contact information: [address] [place] [address] [phone]                Discharge Instructions     Diet - low sodium heart healthy   Complete by: As directed    Increase activity slowly   Complete by: As directed         Discharge Medications   Allergies as  of 08/04/2022       Reactions   Latex Itching   Burning, itching, pain   Pineapple Itching        Medication List     TAKE these medications    acetaminophen 500 MG tablet Commonly known as: TYLENOL Take 500 mg by mouth every 6 (six) hours as needed for mild pain.   Armour Thyroid 60 MG tablet Generic drug: thyroid Take 30-60 mg by mouth See admin instructions. Take 1/2 tablet by mouth every Monday and 1 tablet all other days   Calcium-Vitamin D 600-200 MG-UNIT tablet Take 1 tablet by mouth 2 (two) times daily.   citalopram 40 MG tablet Commonly known as: CELEXA Take 40 mg by mouth daily.   diclofenac 50 MG EC tablet Commonly known as: VOLTAREN TAKE 1 TABLET BY MOUTH TWICE A DAY   Fish Oil 1200 MG Caps Take 1,200 mg by mouth daily.   Garlic 2000 MG Tbec Take 2,000 mg by mouth daily.   Ginkgo Biloba 60 MG Caps Take 60 mg by mouth daily.   Magnesium 250 MG Tabs Take 250 mg by mouth daily.   olmesartan 20 MG tablet Commonly known as: BENICAR Take 20 mg by mouth daily.   Premarin vaginal cream Generic drug: conjugated estrogens Place 1 applicator vaginally once a week.   rosuvastatin 10 MG tablet Commonly known as: Crestor Take 1 tablet (10  mg total) by mouth daily.   Turmeric 500 MG Caps Take 500 mg by mouth daily.   Vitamin B 12 500 MCG Tabs Take 500 mcg by mouth daily.   zinc gluconate 50 MG tablet Take 50 mg by mouth daily.           Outstanding Labs/Studies   Repeat echo for AI, ascending aorta  Duration of Discharge Encounter   Greater than 30 minutes including physician time.  Signed, Marcelino Duster, PA 08/04/2022, 1:53 PM  Patient seen and examined and agree with Micah Flesher, PA as detailed above.  In brief, the patient is a 77 year old female with history of HTN, HLD, depression and asthma who presented to the ER with chest pain with trop 30>39 prompting admission to Cardiology for further evaluation.  CTA today with normal coronaries. Ca score 0. TTE on my review with EF 70%, basal septal hypertrophy, moderate AR, mild MR, RAP 5. Overall, reassuring cardiac work-up and suspect her pain was MSK in nature given that it developed after lifting heavy objects into her car. She is currently comfortable and chest pain free. Will arrange for outpatient follow-up.  GEN: No acute distress.   Neck: No JVD Cardiac: RRR, 2/6 systolic murmur Respiratory: Clear to auscultation bilaterally. GI: Soft, nontender, non-distended  MS: No edema; No deformity. Neuro:  Nonfocal  Psych: Normal affect    Plan: -Coronary CTA with normal coronaries, Ca score 0, possible PFO -TTE on my review with robust EF, moderate AR, mild MR -Patient is overall feeling well with no recurrence of chest pain -Will discharge home today with plans for outpatient CV follow-up  Laurance Flatten, MD

## 2022-08-05 LAB — LIPOPROTEIN A (LPA): Lipoprotein (a): 71.5 nmol/L — ABNORMAL HIGH (ref <75.0)

## 2022-08-08 ENCOUNTER — Ambulatory Visit
Admission: RE | Admit: 2022-08-08 | Discharge: 2022-08-08 | Disposition: A | Discharge status: Home / Self Care | Payer: Medicare HMO | Source: Ambulatory Visit | Attending: Family Medicine | Admitting: Family Medicine

## 2022-08-08 DIAGNOSIS — I700 Atherosclerosis of aorta: Secondary | ICD-10-CM | POA: Diagnosis not present

## 2022-08-08 DIAGNOSIS — R911 Solitary pulmonary nodule: Secondary | ICD-10-CM | POA: Diagnosis not present

## 2022-08-08 DIAGNOSIS — I719 Aortic aneurysm of unspecified site, without rupture: Secondary | ICD-10-CM | POA: Diagnosis not present

## 2022-08-08 MED ORDER — IOPAMIDOL (ISOVUE-300) INJECTION 61%
75.0000 mL | Freq: Once | INTRAVENOUS | Status: AC | PRN
  Administered 2022-08-08: 75 mL via INTRAVENOUS

## 2022-08-12 ENCOUNTER — Telehealth: Payer: Self-pay | Admitting: Cardiology

## 2022-08-12 NOTE — Telephone Encounter (Signed)
Left message for the patient to call the clinic.

## 2022-08-12 NOTE — Telephone Encounter (Signed)
Pt returning call

## 2022-08-12 NOTE — Telephone Encounter (Signed)
Patient is calling because she has an upcoming appt for a hospital f/u. Patient wanted to make [place] aware that since she has been discharged from the hospital, she has had the same episodes 5 different times. Patient states when she has these episodes, she feels like she is wearing a tight bra, that is the only symptom she feels when this occurs. Patient stated it typically has been lasting for an hour, but on one of the days it last for 2 hours. Patient stated she wanted [place] to be aware, but she knows to sit down and try to rest when these episodes occur.

## 2022-08-12 NOTE — Telephone Encounter (Signed)
Patient was recently discharged from the [facility] on 5/8, she is reporting of feeling like "tightness under the breast, it's the location of my bra, however, I am currently not wearing a bra". Pt stated she does experience shortness of breath while she is in pain. Pt is currently asymptomatic, explained ED precautions, pt voiced understanding.

## 2022-08-16 NOTE — Telephone Encounter (Signed)
Will send this information to [facility] PA-C as an Lorain Childes, for they will be seeing the pt for follow-up of this issue tomorrow 5/22.

## 2022-08-16 NOTE — Telephone Encounter (Signed)
CCTA in the hospital with CAC score 0 and no CAD. But if the patient is having active chest pain now, she should go to the ED. Tereso Newcomer, PA-C    08/16/2022 4:39 PM

## 2022-08-16 NOTE — Telephone Encounter (Signed)
Left message for the pt to call the clinic

## 2022-08-17 ENCOUNTER — Encounter: Payer: Self-pay | Admitting: Physician Assistant

## 2022-08-17 ENCOUNTER — Ambulatory Visit: Payer: Medicare HMO | Attending: Physician Assistant | Admitting: Cardiology

## 2022-08-17 VITALS — BP 112/60 | HR 57 | Ht 62.0 in | Wt 143.8 lb

## 2022-08-17 DIAGNOSIS — I10 Essential (primary) hypertension: Secondary | ICD-10-CM

## 2022-08-17 DIAGNOSIS — E785 Hyperlipidemia, unspecified: Secondary | ICD-10-CM

## 2022-08-17 DIAGNOSIS — I77810 Thoracic aortic ectasia: Secondary | ICD-10-CM | POA: Diagnosis not present

## 2022-08-17 DIAGNOSIS — I351 Nonrheumatic aortic (valve) insufficiency: Secondary | ICD-10-CM | POA: Diagnosis not present

## 2022-08-17 DIAGNOSIS — R079 Chest pain, unspecified: Secondary | ICD-10-CM | POA: Diagnosis not present

## 2022-08-17 MED ORDER — NITROGLYCERIN 0.4 MG SL SUBL: 0.4000 mg | SUBLINGUAL_TABLET | SUBLINGUAL | 0 refills | Status: AC | PRN

## 2022-08-17 NOTE — Assessment & Plan Note (Signed)
Mild-moderate aortic valve insufficiency noted on 08/04/22 TTE. No significant symptoms. Will plan to repeat TTE in 1 year.

## 2022-08-17 NOTE — Patient Instructions (Signed)
Medication Instructions:  Your physician recommends that you continue on your current medications as directed. Please refer to the Current Medication list given to you today. BUT WE DID SEND IN A PRESCRIPTION FOR NITROGLYCERIN.  The proper use and anticipated side effects of nitroglycerine has been carefully explained.  If a single episode of chest pain is not relieved by one tablet, the patient will try another within 5 minutes; and if this doesn't relieve the pain, the patient is instructed to call 911 for transportation to an emergency department.  *If you need a refill on your cardiac medications before your next appointment, please call your pharmacy*   Lab Work: None ordered  If you have labs (blood work) drawn today and your tests are completely normal, you will receive your results only by: MyChart Message (if you have MyChart) OR A paper copy in the mail If you have any lab test that is abnormal or we need to change your treatment, we will call you to review the results.   Testing/Procedures: Your physician has requested that you have an echocardiogram AFTER 08/04/2023. Echocardiography is a painless test that uses sound waves to create images of your heart. It provides your doctor with information about the size and shape of your heart and how well your heart's chambers and valves are working. This procedure takes approximately one hour. There are no restrictions for this procedure. Please do NOT wear cologne, perfume, aftershave, or lotions (deodorant is allowed). Please arrive 15 minutes prior to your appointment time.    Follow-Up: At [facility], you and your health needs are our priority.  As part of our continuing mission to provide you with exceptional heart care, we have created designated Provider Care Teams.  These Care Teams include your primary Cardiologist (physician) and Advanced Practice Providers (APPs -  Physician Assistants and Nurse Practitioners) who all  work together to provide you with the care you need, when you need it.  We recommend signing up for the patient portal called "MyChart".  Sign up information is provided on this After Visit Summary.  MyChart is used to connect with patients for Virtual Visits (Telemedicine).  Patients are able to view lab/test results, encounter notes, upcoming appointments, etc.  Non-urgent messages can be sent to your provider as well.   To learn more about what you can do with MyChart, go to [email or web address].    Your next appointment:   1 year(s)  Provider:   Meriam Sprague, MD     Other Instructions

## 2022-08-17 NOTE — Assessment & Plan Note (Signed)
Both recent TTE and CT noted mild dilatation of ascending aorta, 38 and 39mm. Will plan to repeat TTE in one year to monitor.

## 2022-08-17 NOTE — Assessment & Plan Note (Signed)
Blood pressure is excellent today, 112/51mmHg. Continue with Olmesartan 20mg .

## 2022-08-17 NOTE — Progress Notes (Signed)
Cardiology Office Note:    Date:  08/17/2022   ID:  Stavroula Nickel, DOB [birth year], MRN [id number]  PCP:  Lupita Raider, MD  [facility] Providers Cardiologist:  Meriam Sprague, MD    Referring MD: Lupita Raider, MD   Chief Complaint:  No chief complaint on file.    Patient Profile: Hypertension Hyperlipidemia Right pulmonary nodule 1.1x0.9cm per 08/08/22 CT Dilated ascending aorta 38mm dilation per 08/04/22 TTE/26mm on CTA Moderate aortic valve insufficiency with AR Per 08/04/22 TTE Mild MR Per 08/04/22 TTE Depression Asthma  Cardiac Studies & Procedures       ECHOCARDIOGRAM  ECHOCARDIOGRAM COMPLETE 08/04/2022  Narrative ECHOCARDIOGRAM REPORT    Patient Name:   NESA SZOSTEK Date of Exam: 08/04/2022 Medical Rec #:  [id number]          Height:       62.0 in Accession #:    [id number]         Weight:       140.0 lb Date of Birth:  [birth year]           BSA:          1.643 m Patient Age:    77 years           BP:           137/77 mmHg Patient Gender: F                  HR:           59 bpm. Exam Location:  Inpatient  Procedure: 2D Echo, Cardiac Doppler and Color Doppler  Indications:    acute ischemic heart disease  History:        Patient has no prior history of Echocardiogram examinations. Signs/Symptoms:Chest Pain; Risk Factors:Hypertension and Dyslipidemia.  Sonographer:    Delcie Roch RDCS Referring Phys: [id number] DENNIS NARCISSE JR  IMPRESSIONS   1. Left ventricular ejection fraction, by estimation, is 60 to 65%. The left ventricle has normal function. The left ventricle has no regional wall motion abnormalities. Left ventricular diastolic parameters are consistent with Grade I diastolic dysfunction (impaired relaxation). 2. Right ventricular systolic function is normal. The right ventricular size is normal. 3. The mitral valve is abnormal. Mild mitral valve regurgitation. No evidence of mitral stenosis. 4. The aortic valve is  tricuspid. There is moderate calcification of the aortic valve. There is moderate thickening of the aortic valve. Aortic valve regurgitation is mild to moderate. Aortic valve sclerosis/calcification is present, without any evidence of aortic stenosis. 5. Aortic dilatation noted. There is mild dilatation of the ascending aorta, measuring 38 mm. 6. The inferior vena cava is normal in size with greater than 50% respiratory variability, suggesting right atrial pressure of 3 mmHg.  FINDINGS Left Ventricle: Left ventricular ejection fraction, by estimation, is 60 to 65%. The left ventricle has normal function. The left ventricle has no regional wall motion abnormalities. The left ventricular internal cavity size was normal in size. There is no left ventricular hypertrophy. Left ventricular diastolic parameters are consistent with Grade I diastolic dysfunction (impaired relaxation).  Right Ventricle: The right ventricular size is normal. No increase in right ventricular wall thickness. Right ventricular systolic function is normal.  Left Atrium: Left atrial size was normal in size.  Right Atrium: Right atrial size was normal in size.  Pericardium: There is no evidence of pericardial effusion.  Mitral Valve: The mitral valve is abnormal. Mild mitral valve regurgitation. No evidence of mitral valve stenosis.  Tricuspid  Valve: The tricuspid valve is normal in structure. Tricuspid valve regurgitation is mild . No evidence of tricuspid stenosis.  Aortic Valve: The aortic valve is tricuspid. There is moderate calcification of the aortic valve. There is moderate thickening of the aortic valve. Aortic valve regurgitation is mild to moderate. Aortic regurgitation PHT measures 363 msec. Aortic valve sclerosis/calcification is present, without any evidence of aortic stenosis.  Pulmonic Valve: The pulmonic valve was normal in structure. Pulmonic valve regurgitation is mild. No evidence of pulmonic  stenosis.  Aorta: Aortic dilatation noted. There is mild dilatation of the ascending aorta, measuring 38 mm.  Venous: The inferior vena cava is normal in size with greater than 50% respiratory variability, suggesting right atrial pressure of 3 mmHg.  IAS/Shunts: No atrial level shunt detected by color flow Doppler.   LEFT VENTRICLE PLAX 2D LVIDd:         4.20 cm   Diastology LVIDs:         2.40 cm   LV e' medial:    6.09 cm/s LV PW:         1.00 cm   LV E/e' medial:  12.6 LV IVS:        1.00 cm   LV e' lateral:   6.09 cm/s LVOT diam:     2.10 cm   LV E/e' lateral: 12.6 LV SV:         118 LV SV Index:   72 LVOT Area:     3.46 cm   RIGHT VENTRICLE             IVC RV Basal diam:  2.40 cm     IVC diam: 1.20 cm RV S prime:     15.20 cm/s TAPSE (M-mode): 2.9 cm  LEFT ATRIUM             Index        RIGHT ATRIUM           Index LA diam:        3.10 cm 1.89 cm/m   RA Area:     10.90 cm LA Vol (A2C):   47.4 ml 28.85 ml/m  RA Volume:   22.40 ml  13.64 ml/m LA Vol (A4C):   34.9 ml 21.24 ml/m LA Biplane Vol: 40.7 ml 24.77 ml/m AORTIC VALVE LVOT Vmax:   154.00 cm/s LVOT Vmean:  105.000 cm/s LVOT VTI:    0.342 m AI PHT:      363 msec  AORTA Ao Root diam: 3.40 cm Ao Asc diam:  3.80 cm  MITRAL VALVE MV Area (PHT): 3.37 cm    SHUNTS MV Decel Time: 225 msec    Systemic VTI:  0.34 m MV E velocity: 77.00 cm/s  Systemic Diam: 2.10 cm MV A velocity: 84.70 cm/s MV E/A ratio:  0.91  Charlton Haws MD Electronically signed by Charlton Haws MD Signature Date/Time: 08/04/2022/3:13:22 PM    Final     CT SCANS  CT CORONARY MORPH W/CTA COR W/SCORE 08/04/2022  Addendum 08/04/2022  1:07 PM ADDENDUM REPORT: 08/04/2022 13:05  EXAM: OVER-READ INTERPRETATION  CT CHEST  The following report is an over-read performed by radiologist Dr. [facility] on 08/04/2022. This over-read does not include interpretation of cardiac or coronary anatomy or pathology. The  coronary CTA interpretation by the cardiologist is attached.  COMPARISON:  CT scan 08/03/2022  FINDINGS: Fusiform aneurysmal dilatation of the ascending thoracic aorta with maximum measurement of 4.0 cm. Recommend annual imaging followup by CTA  or MRA. This recommendation follows 2010 ACCF/AHA/AATS/ACR/ASA/SCA/SCAI/SIR/STS/SVM Guidelines for the Diagnosis and Management of Patients with Thoracic Aortic Disease. Circulation. 2010; 121: G401-U272. Aortic aneurysm NOS (ICD10-I71.9). No dissection.  No mediastinal or hilar mass or lymphadenopathy. The esophagus is grossly normal. There is a small hiatal hernia noted.  Patchy areas of subsegmental atelectasis but no infiltrates or effusions. As demonstrated on the chest CT from yesterday there is a 11 mm nodule in the right middle lobe. No other pulmonary lesions.  No significant upper abdominal findings.  No significant bony findings.  IMPRESSION: Fusiform aneurysmal dilatation of the ascending thoracic aorta with maximum measurement of 4.0 cm.  11 mm right middle lobe nodule as demonstrated on CT scan from yesterday.  Recommend close surveillance as previously outlined.  Small hiatal hernia.   Electronically Signed By: Rudie Meyer M.D. On: 08/04/2022 13:05  Narrative CLINICAL DATA:  76 Year-old Female  EXAM: Cardiac/Coronary  CTA  TECHNIQUE: The patient was scanned on a [employer].  FINDINGS: Scan was triggered in the descending thoracic aorta. Axial non-contrast 3 mm slices were carried out through the heart. The data set was analyzed on a dedicated work station and scored using the Agatson method. Gantry rotation speed was 250 msecs and collimation was .6 mm. 0.8 mg of sl NTG was given. The 3D data set was reconstructed in 5% intervals of the 67-82 % of the R-R cycle. Diastolic phases were analyzed on a dedicated work station using MPR, MIP and VRT modes. The patient received 95 cc of  contrast.  Coronary Arteries:  Normal coronary origin.  Right dominance.  Coronary Calcium Score:  Left main: 0  Left anterior descending artery: 0  Left circumflex artery: 0  Right coronary artery: 0  Ramus intermedius artery: 0  Total: 0  Percentile: 1st for age, sex, and race matched control.  Plaque Analysis: No plaque, not performed  RCA is a large dominant artery that gives rise to PDA and PLA. There is no significant plaque.  Left main is a large artery that gives rise to LAD, RI, and LCX arteries. There is no significant plaque.  LAD is a large vessel that gives rise to one large D1 Branch and a large septal perforator. There is no significant plaque.  LCX is a non-dominant artery.  There is no significant plaque.  There is a ramus intermedius vessel with no significant plaque.  Other findings:  Aorta: Normal size- 39 mm maximal diameter. No calcifications. No dissection.  Main Pulmonary Artery: Normal size of the pulmonary artery.  Systemic Veins: Normal drainage  Aortic Valve:  Tri-leaflet.  Calcium score 71  Mitral valve: No calcifications.  Normal pulmonary vein drainage into the left atrium.  Normal left atrial appendage without a thrombus. Chicken wing morphology.  Left to right shunting noted; slab artifact through the defect. Most consistent with PFO but not well visualized.  Left Ventricle: Normal size with moderate concentric hypertrophy  Left Atrium: Normal size  Right Ventricle: Normal size  Right Atrium: Normal size  Pericardium: Normal thickness  Extra-cardiac findings: See attached radiology report for non-cardiac structures.  Artifact: Slab artifact  IMPRESSION: 1. Coronary calcium score of 0. This was 1st percentile for age, sex, and race matched control.  2. Normal coronary origin with right dominance.  3. CAD-RADS 0. No evidence of CAD (0%). Consider non-atherosclerotic causes of chest  pain.  RECOMMENDATIONS: RECOMMENDATIONS The proposed cut-off value of 1,651 AU yielded a 93 % sensitivity and 75 % specificity in  grading AS severity in patients with classical low-flow, low-gradient AS. Proposed different cut-off values to define severe AS for men and women as 2,065 AU and 1,274 AU, respectively. The joint European and American recommendations for the assessment of AS consider the aortic valve calcium score as a continuum - a very high calcium score suggests severe AS and a low calcium score suggests severe AS is unlikely.  Sunday Shams, et al. 2017 ESC/EACTS Guidelines for the management of valvular heart disease. Eur Heart J 2017;38:2739-91.  Coronary artery calcium (CAC) score is a strong predictor of incident coronary heart disease (CHD) and provides predictive information beyond traditional risk factors. CAC scoring is reasonable to use in the decision to withhold, postpone, or initiate statin therapy in intermediate-risk or selected borderline-risk asymptomatic adults (age 78-75 years and LDL-C >=70 to <190 mg/dL) who do not have diabetes or established atherosclerotic cardiovascular disease (ASCVD).* In intermediate-risk (10-year ASCVD risk >=7.5% to <20%) adults or selected borderline-risk (10-year ASCVD risk >=5% to <7.5%) adults in whom a CAC score is measured for the purpose of making a treatment decision the following recommendations have been made:  If CAC = 0, it is reasonable to withhold statin therapy and reassess in 5 to 10 years, as long as higher risk conditions are absent (diabetes mellitus, family history of premature CHD in first degree relatives (males <55 years; females <65 years), cigarette smoking, LDL >=190 mg/dL or other independent risk factors).  If CAC is 1 to 99, it is reasonable to initiate statin therapy for patients >=34 years of age.  If CAC is >=100 or >=75th percentile, it is reasonable to initiate statin  therapy at any age.  Cardiology referral should be considered for patients with CAC scores =400 or >=75th percentile.  *2018 AHA/ACC/AACVPR/AAPA/ABC/ACPM/ADA/AGS/APhA/ASPC/NLA/PCNA Guideline on the Management of Blood Cholesterol: A Report of the American College of Cardiology/American Heart Association Task Force on Clinical Practice Guidelines. J Am Coll Cardiol. 2019;73(24):3168-3209.  Riley Lam, MD  Electronically Signed: By: Riley Lam M.D. On: 08/04/2022 12:13           History of Present Illness:   Keydi Alders is a 77 y.o. female with the above problem list.  She presents today for follow up after recent 1 night admission, 5/8-5/9. Patient had onset of chest pain on 5/8 in the setting of lifting heavy objects into her car. She initially presented to urgent care but was then transported to the ED with EMS due to ongoing chest pain. By the time that patient arrived to [facility], she was chest pain free. ED workup unremarkable except for troponin 30->39. She underwent cardiac CTA which was reassuring with calcium score of 0. TTE with normal EF, moderate AR, mild MR. Given reassuring workup and no recurrence of chest pain, patient was discharged the next day, with suspicion of MSK etiology. Since leaving the hospital, patient has also undergone CT chest with contrast which noted 1.1x0.9 cm right middle lobe pulmonary nodule.   Since leaving the hospital, patient reports 5 more episodes of lower chest tightness sensation "like I'm wearing a tight bra." Discomfort starts left lower chest and radiates to either side, rated 4/10 in severity. She has been taking a single baby ASA when pain begins. These episodes have occurred both at rest, such as after eating, and with walking, last about 30 minutes. No identifiable provocating factor. Most of the time, has no symptoms and denies significant change to exertional tolerance. She does feel  that endurence has slowly  declined but no acute change and says that she does feel slightly short of breath in general than she used to. Also some chronic fatigue. Lower chest tightness is relieved by laying down in supine position. Otherwise she denies shortness of breath, lower extremity edema, fatigue, palpitations, melena, hematuria, hemoptysis, diaphoresis, weakness, presyncope, syncope, orthopnea, and PND. Report occasional dizziness when standing too quickly, not a new phenomenon.       Past Medical History:  Diagnosis Date   Allergy    Arthritis    Asthma    Cancer (HCC)    skin   Depression    Thyroid disease    Current Medications: Current Meds  Medication Sig   acetaminophen (TYLENOL) 500 MG tablet Take 500 mg by mouth every 6 (six) hours as needed for mild pain.   Calcium-Vitamin D 600-200 MG-UNIT per tablet Take 1 tablet by mouth 2 (two) times daily.   citalopram (CELEXA) 40 MG tablet Take 40 mg by mouth daily.   Cyanocobalamin (VITAMIN B 12) 500 MCG TABS Take 500 mcg by mouth daily.   diclofenac (VOLTAREN) 50 MG EC tablet TAKE 1 TABLET BY MOUTH TWICE A DAY   Garlic 2000 MG TBEC Take 2,000 mg by mouth daily.   Ginkgo Biloba 60 MG CAPS Take 60 mg by mouth daily.   Magnesium 250 MG TABS Take 250 mg by mouth daily.   Misc Natural Products (NEURIVA PO) Take 1 capsule by mouth daily.   nitroGLYCERIN (NITROSTAT) 0.4 MG SL tablet Place 1 tablet (0.4 mg total) under the tongue every 5 (five) minutes as needed for chest pain.   olmesartan (BENICAR) 20 MG tablet Take 20 mg by mouth daily.   Omega-3 Fatty Acids (FISH OIL) 1200 MG CAPS Take 1,200 mg by mouth daily.   PREMARIN vaginal cream Place 1 applicator vaginally once a week.   rosuvastatin (CRESTOR) 10 MG tablet Take 1 tablet (10 mg total) by mouth daily.   Theanine 100 MG CAPS Take 1 capsule by mouth daily.   thyroid (ARMOUR THYROID) 60 MG tablet Take 30-60 mg by mouth See admin instructions. Take 1/2 tablet by mouth every Monday and 1 tablet all other  days   Turmeric 500 MG CAPS Take 500 mg by mouth daily.   zinc gluconate 50 MG tablet Take 50 mg by mouth daily.    Allergies:   Latex and Pineapple   Social History   Occupational History   Not on file  Tobacco Use   Smoking status: Never   Smokeless tobacco: Never  Vaping Use   Vaping Use: Never used  Substance and Sexual Activity   Alcohol use: Yes    Alcohol/week: 0.0 standard drinks of alcohol    Comment: occ. wine per pt   Drug use: No   Sexual activity: Not on file    Family Hx: The patient's family history is negative for Colon cancer, Colon polyps, Esophageal cancer, Rectal cancer, and Stomach cancer.  Review of Systems  Constitutional: Positive for malaise/fatigue (mild). Negative for weight gain and weight loss.  Cardiovascular:  Positive for chest pain (episodic left lower chest wall tightness). Negative for dyspnea on exertion, irregular heartbeat, leg swelling, orthopnea, palpitations and syncope.  Respiratory:  Negative for cough and shortness of breath.   Neurological:  Negative for focal weakness and loss of balance.  All other systems reviewed and are negative.    EKGs/Labs/Other Test Reviewed:    EKG:  EKG is ordered today.  The ekg ordered today demonstrates sinus rhythm with slight right ventricular conduction delay. No acute ischemic changes. Stable with recent ED ECG.    Recent Labs: 08/03/2022: ALT 17; Magnesium 2.1 08/04/2022: B Natriuretic Peptide 139.3; BUN 7; Creatinine, Ser 0.69; Hemoglobin 13.3; Platelets 191; Potassium 3.8; Sodium 134; TSH 1.491   Recent Lipid Panel Recent Labs    08/04/22 0512  CHOL 197  TRIG 54  HDL 61  VLDL 11  LDLCALC 125*      Risk Assessment/Calculations/Metrics:              Physical Exam:    VS:  BP 112/60   Pulse (!) 57   Ht 5\' 2"  (1.575 m)   Wt 143 lb 12.8 oz (65.2 kg)   SpO2 97%   BMI 26.30 kg/m     Wt Readings from Last 3 Encounters:  08/17/22 143 lb 12.8 oz (65.2 kg)  08/04/22 141 lb 5 oz  (64.1 kg)  03/12/18 140 lb (63.5 kg)    Constitutional:      Appearance: Healthy appearance. Not in distress.  Neck:     Vascular: No JVR. JVD normal.  Pulmonary:     Effort: Pulmonary effort is normal.     Breath sounds: Normal breath sounds. No wheezing. No rhonchi. No rales.  Chest:     Chest wall: Tender to palpatation (left chest wall tenderness to palpation from sternum to under breast).  Cardiovascular:     PMI at left midclavicular line. Normal rate. Regular rhythm. Normal S1. Normal S2.      Murmurs: There is no murmur.     No gallop.  No click. No rub.  Pulses:    Intact distal pulses.  Edema:    Peripheral edema absent.  Abdominal:     General: Bowel sounds are normal.     Palpations: Abdomen is soft.     Tenderness: There is no abdominal tenderness.  Musculoskeletal: Normal range of motion.        General: No tenderness. Skin:    General: Skin is warm and dry.  Neurological:     General: No focal deficit present.     Mental Status: Alert and oriented to person, place and time.         ASSESSMENT & PLAN:   Essential hypertension Blood pressure is excellent today, 112/15mmHg. Continue with Olmesartan 20mg .  Aortic insufficiency Mild-moderate aortic valve insufficiency noted on 08/04/22 TTE. No significant symptoms. Will plan to repeat TTE in 1 year.   Hyperlipidemia LDL goal <100 LDL noted to be 125 during recent admission. Given family hx CAD, patient placed on Crestor 10mg . She is tolerating well, no side effects. She will need repeat lipid panel in the next 4-12 weeks, plans to have this done with her PCP.   Chest pain of uncertain etiology Patient with 5 more episodes of chest "tightness", starting in left lower chest/upper abdomen area. On exam today, the same pain is reproducible with palpation of rib/intercostal space from just below sternum to mid-clavicular line. Given cardiac calcium score of 0 and no exertional provocation, along with normal EF on recent  echocardiogram, low suspicion for cardiac etiology of discomfort. I wonder if patient has inflammatory etiology such as costochondritis. If there is cardiac etiology, would likely be vasospasm (though symptoms and reproducible nature not consistent with this). Although unlikely, will give patient PRN sublingual nitroglycerin which she can try taking with prolonged discomfort. She currently had Diclofenac tablets at home that she takes PRN for  arthritic pain. Discussed that she could consider taking daily for a few days to see if symptoms improved. Patient also advised to follow up with her PCP for further non-cardiac workup.  Ascending aorta dilatation (HCC) Both recent TTE and CT noted mild dilatation of ascending aorta, 38 and 39mm. Will plan to repeat TTE in one year to monitor.             Dispo: 1 year   Medication Adjustments/Labs and Tests Ordered: Current medicines are reviewed at length with the patient today.  Concerns regarding medicines are outlined above.  Tests Ordered: Orders Placed This Encounter  Procedures   EKG 12-Lead   ECHOCARDIOGRAM COMPLETE   Medication Changes: Meds ordered this encounter  Medications   nitroGLYCERIN (NITROSTAT) 0.4 MG SL tablet    Sig: Place 1 tablet (0.4 mg total) under the tongue every 5 (five) minutes as needed for chest pain.    Dispense:  30 tablet    Refill:  0   Con Memos, PA-C  08/17/2022 9:21 AM    [facility] [address] [place], [place], [place]  [address] Phone: [phone]; Fax: [phone]

## 2022-08-17 NOTE — Assessment & Plan Note (Signed)
Patient with 5 more episodes of chest "tightness", starting in left lower chest/upper abdomen area. On exam today, the same pain is reproducible with palpation of rib/intercostal space from just below sternum to mid-clavicular line. Given cardiac calcium score of 0 and no exertional provocation, along with normal EF on recent echocardiogram, low suspicion for cardiac etiology of discomfort. I wonder if patient has inflammatory etiology such as costochondritis. If there is cardiac etiology, would likely be vasospasm (though symptoms and reproducible nature not consistent with this). Although unlikely, will give patient PRN sublingual nitroglycerin which she can try taking with prolonged discomfort. She currently had Diclofenac tablets at home that she takes PRN for arthritic pain. Discussed that she could consider taking daily for a few days to see if symptoms improved. Patient also advised to follow up with her PCP for further non-cardiac workup.

## 2022-08-17 NOTE — Assessment & Plan Note (Signed)
LDL noted to be 125 during recent admission. Given family hx CAD, patient placed on Crestor 10mg . She is tolerating well, no side effects. She will need repeat lipid panel in the next 4-12 weeks, plans to have this done with her PCP.

## 2022-09-06 DIAGNOSIS — H35372 Puckering of macula, left eye: Secondary | ICD-10-CM | POA: Diagnosis not present

## 2022-09-06 DIAGNOSIS — H33312 Horseshoe tear of retina without detachment, left eye: Secondary | ICD-10-CM | POA: Diagnosis not present

## 2022-09-06 DIAGNOSIS — H26492 Other secondary cataract, left eye: Secondary | ICD-10-CM | POA: Diagnosis not present

## 2022-09-13 DIAGNOSIS — K439 Ventral hernia without obstruction or gangrene: Secondary | ICD-10-CM | POA: Diagnosis not present

## 2022-09-15 DIAGNOSIS — H26492 Other secondary cataract, left eye: Secondary | ICD-10-CM | POA: Diagnosis not present

## 2022-09-15 DIAGNOSIS — H35372 Puckering of macula, left eye: Secondary | ICD-10-CM | POA: Diagnosis not present

## 2022-09-15 DIAGNOSIS — H33312 Horseshoe tear of retina without detachment, left eye: Secondary | ICD-10-CM | POA: Diagnosis not present

## 2022-10-03 DIAGNOSIS — I5189 Other ill-defined heart diseases: Secondary | ICD-10-CM | POA: Diagnosis not present

## 2022-10-03 DIAGNOSIS — I77810 Thoracic aortic ectasia: Secondary | ICD-10-CM | POA: Diagnosis not present

## 2022-10-03 DIAGNOSIS — N952 Postmenopausal atrophic vaginitis: Secondary | ICD-10-CM | POA: Diagnosis not present

## 2022-10-03 DIAGNOSIS — R911 Solitary pulmonary nodule: Secondary | ICD-10-CM | POA: Diagnosis not present

## 2022-10-03 DIAGNOSIS — F3341 Major depressive disorder, recurrent, in partial remission: Secondary | ICD-10-CM | POA: Diagnosis not present

## 2022-10-03 DIAGNOSIS — E782 Mixed hyperlipidemia: Secondary | ICD-10-CM | POA: Diagnosis not present

## 2022-10-03 DIAGNOSIS — R41840 Attention and concentration deficit: Secondary | ICD-10-CM | POA: Diagnosis not present

## 2022-10-03 DIAGNOSIS — E039 Hypothyroidism, unspecified: Secondary | ICD-10-CM | POA: Diagnosis not present

## 2022-10-03 DIAGNOSIS — I700 Atherosclerosis of aorta: Secondary | ICD-10-CM | POA: Diagnosis not present

## 2022-10-03 DIAGNOSIS — I10 Essential (primary) hypertension: Secondary | ICD-10-CM | POA: Diagnosis not present

## 2022-10-06 ENCOUNTER — Encounter: Payer: Self-pay | Admitting: Emergency Medicine

## 2022-10-06 ENCOUNTER — Institutional Professional Consult (permissible substitution): Payer: Medicare HMO | Admitting: Emergency Medicine

## 2022-10-06 ENCOUNTER — Ambulatory Visit: Payer: Medicare HMO | Admitting: Emergency Medicine

## 2022-10-06 VITALS — BP 126/74 | HR 61 | Temp 98.9°F | Ht 62.5 in | Wt 144.4 lb

## 2022-10-06 DIAGNOSIS — R911 Solitary pulmonary nodule: Secondary | ICD-10-CM | POA: Diagnosis not present

## 2022-10-06 NOTE — Patient Instructions (Signed)
We reviewed your CT scans of the chest today. We will plan to perform a repeat CT chest in early August 2024 to compare with your priors. Follow Dr. Delton Coombes next available in August after your CT scan so we can review that result together.

## 2022-10-06 NOTE — Addendum Note (Signed)
Addended by: Dorisann Frames R on: 10/06/2022 09:46 AM   Modules accepted: Orders

## 2022-10-06 NOTE — Progress Notes (Signed)
Subjective:    Patient ID: Sheri Ray, female    DOB: 05-08-1945, 77 y.o.   MRN: [id number]  HPI 77 year old never smoker with a history of asthma, depression, hypothyroidism, allergic rhinitis.  She is referred today for evaluation of abnormal CT scan of the chest.   Was evaluated for chest discomfort in May 2024 and underwent CT-PA 08/03/2022 as below.  This showed no evidence of pulmonary embolism, no mediastinal or hilar adenopathy, and a 1.4 cm right middle lobe pulmonary nodule.  There is also a 6 mm left fisular lymph node.  The nodule was confirmed on a subsequent coronary morphology CTA on 08/04/2022 as well as a dedicated CT chest without contrast 08/08/2022 as below. The cards eval was reassuring.  No dyspnea except w hills, no limitations. No cough. No wheeze.   CT scan of the chest 08/08/2022 reviewed by me shows a 1.1 x 0.9 cm right middle lobe pulmonary nodule   Review of Systems As per HPI  Past Medical History:  Diagnosis Date   Allergy    Arthritis    Asthma    Cancer (HCC)    skin   Depression    Thyroid disease      Family History  Problem Relation Age of Onset   Colon cancer Neg Hx    Colon polyps Neg Hx    Esophageal cancer Neg Hx    Rectal cancer Neg Hx    Stomach cancer Neg Hx     No family hx lung CA  Social History   Socioeconomic History   Marital status: Legally Separated    Spouse name: Not on file   Number of children: Not on file   Years of education: Not on file   Highest education level: Not on file  Occupational History   Not on file  Tobacco Use   Smoking status: Never   Smokeless tobacco: Never  Vaping Use   Vaping status: Never Used  Substance and Sexual Activity   Alcohol use: Yes    Alcohol/week: 0.0 standard drinks of alcohol    Comment: occ. wine per pt   Drug use: No   Sexual activity: Not on file  Other Topics Concern   Not on file  Social History Narrative   Not on file   Social Determinants of Health    Financial Resource Strain: Not on file  Food Insecurity: Not on file  Transportation Needs: Not on file  Physical Activity: Not on file  Stress: Not on file  Social Connections: Unknown (08/03/2022)   [facility]   Social Network    Social Network: Not on file  Intimate Partner Violence: Unknown (08/03/2022)   Received from [facility]   HITS    Physically Hurt: Not on file    Insult or Talk Down To: Not on file    Threaten Physical Harm: Not on file    Scream or Curse: Not on file    Has been a Runner, broadcasting/film/video, 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    Allergies  Allergen Reactions   Latex Itching    Burning, itching, pain   Pineapple Itching     Outpatient Medications Prior to Visit  Medication Sig Dispense Refill   acetaminophen (TYLENOL) 500 MG tablet Take 500 mg by mouth every 6 (six) hours as needed for mild pain.     Calcium-Vitamin D 600-200 MG-UNIT per tablet Take 1 tablet by mouth  2 (two) times daily.     citalopram (CELEXA) 40 MG tablet Take 40 mg by mouth daily.     Cyanocobalamin (VITAMIN B 12) 500 MCG TABS Take 500 mcg by mouth daily.     diclofenac (VOLTAREN) 50 MG EC tablet TAKE 1 TABLET BY MOUTH TWICE A DAY 60 tablet 0   Garlic 2000 MG TBEC Take 2,000 mg by mouth daily.     Ginkgo Biloba 60 MG CAPS Take 60 mg by mouth daily.     Magnesium 250 MG TABS Take 250 mg by mouth daily.     Misc Natural Products (NEURIVA PO) Take 1 capsule by mouth daily.     nitroGLYCERIN (NITROSTAT) 0.4 MG SL tablet Place 1 tablet (0.4 mg total) under the tongue every 5 (five) minutes as needed for chest pain. 30 tablet 0   olmesartan (BENICAR) 20 MG tablet Take 20 mg by mouth daily.     Omega-3 Fatty Acids (FISH OIL) 1200 MG CAPS Take 1,200 mg by mouth daily.     PREMARIN vaginal cream Place 1 applicator vaginally once a week.     rosuvastatin (CRESTOR) 10 MG tablet Take 1 tablet (10 mg total) by mouth daily. 90  tablet 3   Theanine 100 MG CAPS Take 1 capsule by mouth daily.     thyroid (ARMOUR THYROID) 60 MG tablet Take 30-60 mg by mouth See admin instructions. Take 1/2 tablet by mouth every Monday and 1 tablet all other days     Turmeric 500 MG CAPS Take 500 mg by mouth daily.     zinc gluconate 50 MG tablet Take 50 mg by mouth daily.     No facility-administered medications prior to visit.         Objective:   Physical Exam  Vitals:   10/06/22 0902  BP: 126/74  Pulse: 61  Temp: 98.9 F (37.2 C)  TempSrc: Oral  SpO2: 99%  Weight: 144 lb 6.4 oz (65.5 kg)  Height: 5' 2.5" (1.588 m)   Gen: Pleasant, well-nourished, in no distress,  normal affect  ENT: No lesions,  mouth clear,  oropharynx clear, no postnasal drip  Neck: No JVD, no stridor  Lungs: No use of accessory muscles, no crackles or wheezing on normal respiration, no wheeze on forced expiration  Cardiovascular: RRR, heart sounds normal, no murmur or gallops, no peripheral edema  Musculoskeletal: No deformities, no cyanosis or clubbing  Neuro: alert, awake, non focal  Skin: Warm, no lesions or rash    Assessment & Plan:   Pulmonary nodule 1 cm or greater in diameter Right middle lobe pulmonary nodule, initially identified on CT abdomen February 2024, better characterized on several CT scans of the chest May/2024.  1.1 cm in largest dimension.  Explained to the differential diagnosis.  We will follow a repeat CT at the 22-month mark which is September.  Discussed diagnostics with her including navigational bronchoscopy.  If the nodule increases in size then we will consider either this or primary resection.  Follow-up in September after the CT.  If the nodule remains stable then we will follow for at least 2 years.   Levy Pupa, MD, PhD 10/06/2022, 9:41 AM [facility] [phone] or if no answer before 7:00PM call [phone] For any issues after 7:00PM please call eLink [phone]

## 2022-10-06 NOTE — Assessment & Plan Note (Signed)
Right middle lobe pulmonary nodule, initially identified on CT abdomen February 2024, better characterized on several CT scans of the chest May/2024.  1.1 cm in largest dimension.  Explained to the differential diagnosis.  We will follow a repeat CT at the 51-month mark which is September.  Discussed diagnostics with her including navigational bronchoscopy.  If the nodule increases in size then we will consider either this or primary resection.  Follow-up in September after the CT.  If the nodule remains stable then we will follow for at least 2 years.

## 2022-10-13 DIAGNOSIS — Z9889 Other specified postprocedural states: Secondary | ICD-10-CM | POA: Diagnosis not present

## 2022-10-13 DIAGNOSIS — Z8719 Personal history of other diseases of the digestive system: Secondary | ICD-10-CM | POA: Diagnosis not present

## 2022-10-14 ENCOUNTER — Ambulatory Visit: Payer: Medicare HMO | Admitting: Physician Assistant

## 2022-10-21 DIAGNOSIS — H35372 Puckering of macula, left eye: Secondary | ICD-10-CM | POA: Diagnosis not present

## 2022-11-01 ENCOUNTER — Other Ambulatory Visit: Payer: Medicare HMO

## 2022-11-08 ENCOUNTER — Encounter: Payer: Self-pay | Admitting: Podiatry

## 2022-11-08 ENCOUNTER — Ambulatory Visit: Payer: Medicare HMO | Admitting: Podiatry

## 2022-11-08 DIAGNOSIS — Q828 Other specified congenital malformations of skin: Secondary | ICD-10-CM | POA: Diagnosis not present

## 2022-11-08 DIAGNOSIS — I5189 Other ill-defined heart diseases: Secondary | ICD-10-CM | POA: Insufficient documentation

## 2022-11-08 DIAGNOSIS — M79671 Pain in right foot: Secondary | ICD-10-CM | POA: Diagnosis not present

## 2022-11-08 DIAGNOSIS — I700 Atherosclerosis of aorta: Secondary | ICD-10-CM | POA: Insufficient documentation

## 2022-11-08 DIAGNOSIS — M79672 Pain in left foot: Secondary | ICD-10-CM | POA: Diagnosis not present

## 2022-11-08 DIAGNOSIS — I77819 Aortic ectasia, unspecified site: Secondary | ICD-10-CM | POA: Insufficient documentation

## 2022-11-09 ENCOUNTER — Ambulatory Visit
Admission: RE | Admit: 2022-11-09 | Discharge: 2022-11-09 | Disposition: A | Discharge status: Home / Self Care | Payer: Medicare HMO | Source: Ambulatory Visit | Attending: Emergency Medicine | Admitting: Emergency Medicine

## 2022-11-09 DIAGNOSIS — I7121 Aneurysm of the ascending aorta, without rupture: Secondary | ICD-10-CM | POA: Diagnosis not present

## 2022-11-09 DIAGNOSIS — I700 Atherosclerosis of aorta: Secondary | ICD-10-CM | POA: Diagnosis not present

## 2022-11-09 DIAGNOSIS — R911 Solitary pulmonary nodule: Secondary | ICD-10-CM

## 2022-11-09 DIAGNOSIS — K8020 Calculus of gallbladder without cholecystitis without obstruction: Secondary | ICD-10-CM | POA: Diagnosis not present

## 2022-11-09 DIAGNOSIS — R918 Other nonspecific abnormal finding of lung field: Secondary | ICD-10-CM | POA: Diagnosis not present

## 2022-11-13 NOTE — Progress Notes (Signed)
  Subjective:  Patient ID: Sheri Ray, female    DOB: 07-16-1945,  MRN: [id number]  Sheri Ray presents to clinic today for painful porokeratotic lesions of both feet. Pain prevent(s) comfortable ambulation. Aggravating factor is weightbearing with and without shoegear.  Chief Complaint  Patient presents with   RFC    PCP Cam Hai MD LOV 07/18/22   New problem(s): None.   PCP is Lupita Raider, MD.  Allergies  Allergen Reactions   Amphetamine Sulfate     Other Reaction(s): tics   Cetirizine Itching   Latex Itching    Burning, itching, pain   Methylphenidate     Other Reaction(s): tics   Pineapple Itching   Surgical Lubricant     Other Reaction(s): abd pain and irritation    Review of Systems: Negative except as noted in the HPI.  Objective: No changes noted in today's physical examination. There were no vitals filed for this visit. Sheri Ray is a pleasant 77 y.o. female WD, WN in NAD. AAO x 3.  Vascular Examination: CFT <3 seconds b/l LE.Palpable pedal pulses b/l LE. Digital hair present b/l. No pedal edema b/l. Skin temperature gradient WNL b/l. No varicosities b/l. Marland Kitchen  Dermatological Examination: Pedal skin with normal turgor, texture and tone b/l. No open wounds. No interdigital macerations b/l.   Toenails 1-5 b/l well maintained with adequate length. No erythema, no edema, no drainage, no fluctuance.   Porokeratotic lesion(s) submet head 2 b/l and submet head 3 b/l. No erythema, no edema, no drainage, no fluctuance.  Neurological Examination: Protective sensation intact with 10 gram monofilament b/l LE. Vibratory sensation intact b/l LE.   Musculoskeletal Examination: Muscle strength 5/5 to all LE muscle groups b/l. Plantarflexed metatarsal(s) 2nd metatarsal head b/l lower extremities and 3rd metatarsal head b/l lower extremities.  Assessment/Plan: 1. Porokeratosis   2. Pain in both feet     -Patient was evaluated and treated. All  patient's and/or POA's questions/concerns answered on today's visit. -No new findings. No new orders. -Patient to continue soft, supportive shoe gear daily. -Porokeratotic lesion(s) submet head 2 b/l and submet head 3 b/l pared and enucleated with sterile currette without incident. Total number of lesions debrided=4. -Patient/POA to call should there be question/concern in the interim.   Return in about 9 weeks (around 01/10/2023).  Freddie Breech, DPM

## 2022-11-16 ENCOUNTER — Encounter: Payer: Self-pay | Admitting: Emergency Medicine

## 2022-11-16 ENCOUNTER — Ambulatory Visit: Payer: Medicare HMO | Admitting: Emergency Medicine

## 2022-11-16 VITALS — BP 114/62 | HR 73 | Ht 62.0 in | Wt 147.0 lb

## 2022-11-16 DIAGNOSIS — R911 Solitary pulmonary nodule: Secondary | ICD-10-CM

## 2022-11-16 NOTE — Patient Instructions (Signed)
We reviewed your CT scan of the chest today.  Your pulmonary nodule has been stable for 6 months. We will plan to repeat a CT scan of your chest in August 2025 to ensure no interval change. Please follow Dr. Delton Coombes in August 2025 after your CT so we can review those results together.

## 2022-11-16 NOTE — Assessment & Plan Note (Signed)
No interval change going back to February 2024.  Reassured her about this.  She is a low risk patient but I think based on the size of the nodule it needs to be followed for 2 years total.  We will plan to repeat her CT in August 2025

## 2022-11-16 NOTE — Progress Notes (Signed)
Subjective:    Patient ID: Sheri Ray, female    DOB: [birth year], 77 y.o.   MRN: [id number]  HPI 77 year old never smoker with a history of asthma, depression, hypothyroidism, allergic rhinitis.  She is referred today for evaluation of abnormal CT scan of the chest.   Was evaluated for chest discomfort in May 2024 and underwent CT-PA 08/03/2022 as below.  This showed no evidence of pulmonary embolism, no mediastinal or hilar adenopathy, and a 1.4 cm right middle lobe pulmonary nodule.  There is also a 6 mm left fisular lymph node.  The nodule was confirmed on a subsequent coronary morphology CTA on 08/04/2022 as well as a dedicated CT chest without contrast 08/08/2022 as below. The cards eval was reassuring.  No dyspnea except w hills, no limitations. No cough. No wheeze.   CT scan of the chest 08/08/2022 reviewed by me shows a 1.1 x 0.9 cm right middle lobe pulmonary nodule   ROV 11/16/2022 --77 year old woman, never smoker with a history of asthma, depression, hypothyroidism, allergic rhinitis.  I saw her in July for right middle lobe pulmonary nodule that was seen on CT-PA in May 2024.  There was also a 6 mm left fistular lymph node.  She returns today for follow-up after repeat CT.  She is feeling fatigued, no dyspnea. Some occasional cough and throat clearing.   Super D CT chest performed on 11/09/2022 reviewed by me, shows no change and an 8 mm right middle lobe pulmonary nodule (compared with 04/2022), surrounding groundglass.  There is a 4 mm lateral right lower lobe nodule also unchanged.  No new nodules seen   Review of Systems As per HPI  Past Medical History:  Diagnosis Date   Allergy    Arthritis    Asthma    Cancer (HCC)    skin   Depression    Thyroid disease      Family History  Problem Relation Age of Onset   Colon cancer Neg Hx    Colon polyps Neg Hx    Esophageal cancer Neg Hx    Rectal cancer Neg Hx    Stomach cancer Neg Hx     No family hx lung  CA  Social History   Socioeconomic History   Marital status: Legally Separated    Spouse name: Not on file   Number of children: Not on file   Years of education: Not on file   Highest education level: Not on file  Occupational History   Not on file  Tobacco Use   Smoking status: Never   Smokeless tobacco: Never  Vaping Use   Vaping status: Never Used  Substance and Sexual Activity   Alcohol use: Yes    Alcohol/week: 0.0 standard drinks of alcohol    Comment: occ. wine per pt   Drug use: No   Sexual activity: Not on file  Other Topics Concern   Not on file  Social History Narrative   Not on file   Social Determinants of Health   Financial Resource Strain: Not on file  Food Insecurity: Not on file  Transportation Needs: Not on file  Physical Activity: Not on file  Stress: Not on file  Social Connections: Unknown (08/03/2022)   Received from [facility], [facility]   Social Network    Social Network: Not on file  Intimate Partner Violence: Unknown (08/03/2022)   Received from [facility], [facility]   HITS    Physically Hurt: Not on file  Insult or Talk Down To: Not on file    Threaten Physical Harm: Not on file    Scream or Curse: Not on file    Has been a teacher, 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    Allergies  Allergen Reactions   Amphetamine Sulfate     Other Reaction(s): tics   Cetirizine Itching   Latex Itching    Burning, itching, pain   Methylphenidate     Other Reaction(s): tics   Pineapple Itching   Surgical Lubricant     Other Reaction(s): abd pain and irritation     Outpatient Medications Prior to Visit  Medication Sig Dispense Refill   acetaminophen (TYLENOL) 500 MG tablet Take 500 mg by mouth every 6 (six) hours as needed for mild pain.     Calcium-Vitamin D 600-200 MG-UNIT per tablet Take 1 tablet by mouth 2 (two) times daily.     citalopram (CELEXA) 40 MG tablet Take  40 mg by mouth daily.     Cyanocobalamin (VITAMIN B 12) 500 MCG TABS Take 500 mcg by mouth daily.     diclofenac (VOLTAREN) 50 MG EC tablet TAKE 1 TABLET BY MOUTH TWICE A DAY 60 tablet 0   Garlic 2000 MG TBEC Take 2,000 mg by mouth daily.     Ginkgo Biloba 60 MG CAPS Take 60 mg by mouth daily.     Magnesium 250 MG TABS Take 250 mg by mouth daily.     Misc Natural Products (NEURIVA PO) Take 1 capsule by mouth daily.     olmesartan (BENICAR) 20 MG tablet Take 20 mg by mouth daily.     Omega-3 Fatty Acids (FISH OIL) 1200 MG CAPS Take 1,200 mg by mouth daily.     PREMARIN vaginal cream Place 1 applicator vaginally once a week.     rosuvastatin (CRESTOR) 10 MG tablet Take 1 tablet (10 mg total) by mouth daily. 90 tablet 3   Theanine 100 MG CAPS Take 1 capsule by mouth daily.     thyroid (ARMOUR THYROID) 60 MG tablet Take 30-60 mg by mouth See admin instructions. Take 1/2 tablet by mouth every Monday and 1 tablet all other days     Turmeric 500 MG CAPS Take 500 mg by mouth daily.     zinc gluconate 50 MG tablet Take 50 mg by mouth daily.     nitroGLYCERIN (NITROSTAT) 0.4 MG SL tablet Place 1 tablet (0.4 mg total) under the tongue every 5 (five) minutes as needed for chest pain. 30 tablet 0   No facility-administered medications prior to visit.         Objective:   Physical Exam  Vitals:   11/16/22 1422  BP: 114/62  Pulse: 73  SpO2: 96%  Weight: 147 lb (66.7 kg)  Height: 5\' 2"  (1.575 m)   Gen: Pleasant, well-nourished, in no distress,  normal affect  ENT: No lesions,  mouth clear,  oropharynx clear, no postnasal drip  Neck: No JVD, no stridor  Lungs: No use of accessory muscles, no crackles or wheezing on normal respiration, no wheeze on forced expiration  Cardiovascular: RRR, heart sounds normal, no murmur or gallops, no peripheral edema  Musculoskeletal: No deformities, no cyanosis or clubbing  Neuro: alert, awake, non focal  Skin: Warm, no lesions or rash    Assessment &  Plan:   Pulmonary nodule 1 cm or greater in diameter No interval change going back to February 2024.  Reassured her about this.  She is a low risk patient but I think based on the size of the nodule it needs to be followed for 2 years total.  We will plan to repeat her CT in August 2025    Levy Pupa, MD, PhD 11/16/2022, 2:38 PM [facility] [phone] or if no answer before 7:00PM call [phone] For any issues after 7:00PM please call eLink [phone]

## 2022-12-05 DIAGNOSIS — R5383 Other fatigue: Secondary | ICD-10-CM | POA: Diagnosis not present

## 2022-12-05 DIAGNOSIS — R41840 Attention and concentration deficit: Secondary | ICD-10-CM | POA: Diagnosis not present

## 2022-12-27 DIAGNOSIS — K219 Gastro-esophageal reflux disease without esophagitis: Secondary | ICD-10-CM | POA: Diagnosis not present

## 2022-12-27 DIAGNOSIS — Z23 Encounter for immunization: Secondary | ICD-10-CM | POA: Diagnosis not present

## 2022-12-27 DIAGNOSIS — R41840 Attention and concentration deficit: Secondary | ICD-10-CM | POA: Diagnosis not present

## 2022-12-27 DIAGNOSIS — N952 Postmenopausal atrophic vaginitis: Secondary | ICD-10-CM | POA: Diagnosis not present

## 2022-12-27 DIAGNOSIS — R5383 Other fatigue: Secondary | ICD-10-CM | POA: Diagnosis not present

## 2023-01-12 ENCOUNTER — Ambulatory Visit: Payer: Medicare HMO | Admitting: Podiatry

## 2023-01-13 DIAGNOSIS — L570 Actinic keratosis: Secondary | ICD-10-CM | POA: Diagnosis not present

## 2023-01-13 DIAGNOSIS — L720 Epidermal cyst: Secondary | ICD-10-CM | POA: Diagnosis not present

## 2023-01-13 DIAGNOSIS — Z85828 Personal history of other malignant neoplasm of skin: Secondary | ICD-10-CM | POA: Diagnosis not present

## 2023-01-13 DIAGNOSIS — L821 Other seborrheic keratosis: Secondary | ICD-10-CM | POA: Diagnosis not present

## 2023-01-13 DIAGNOSIS — L814 Other melanin hyperpigmentation: Secondary | ICD-10-CM | POA: Diagnosis not present

## 2023-01-13 DIAGNOSIS — D0439 Carcinoma in situ of skin of other parts of face: Secondary | ICD-10-CM | POA: Diagnosis not present

## 2023-01-13 DIAGNOSIS — D1801 Hemangioma of skin and subcutaneous tissue: Secondary | ICD-10-CM | POA: Diagnosis not present

## 2023-01-13 DIAGNOSIS — D2372 Other benign neoplasm of skin of left lower limb, including hip: Secondary | ICD-10-CM | POA: Diagnosis not present

## 2023-01-19 ENCOUNTER — Ambulatory Visit: Payer: Medicare HMO | Admitting: Podiatry

## 2023-01-19 ENCOUNTER — Encounter: Payer: Self-pay | Admitting: Podiatry

## 2023-01-19 DIAGNOSIS — M79672 Pain in left foot: Secondary | ICD-10-CM | POA: Diagnosis not present

## 2023-01-19 DIAGNOSIS — M79671 Pain in right foot: Secondary | ICD-10-CM

## 2023-01-19 DIAGNOSIS — Q828 Other specified congenital malformations of skin: Secondary | ICD-10-CM | POA: Diagnosis not present

## 2023-01-26 NOTE — Progress Notes (Signed)
  Subjective:  Patient ID: Sheri Ray, female    DOB: Oct 14, 1945,  MRN: [id number]  Sheri Ray presents to clinic today for: painful porokeratotic lesions of both feet. Pain prevent(s) comfortable ambulation. Aggravating factor is weightbearing with and without shoegear.   She states she is starting to experience pain under 2nd MPJ of her right foot. Denies any redness, drainage, swelling or trauma.  PCP is Lupita Raider, MD.  Allergies  Allergen Reactions   Amphetamine Sulfate     Other Reaction(s): tics   Cetirizine Itching   Latex Itching    Burning, itching, pain   Methylphenidate     Other Reaction(s): tics   Pineapple Itching   Surgical Lubricant     Other Reaction(s): abd pain and irritation    Review of Systems: Negative except as noted in the HPI.  Objective: No changes noted in today's physical examination. There were no vitals filed for this visit.  Sheri Ray is a pleasant 77 y.o. female in NAD. AAO x 3.  Vascular Examination: Capillary refill time <3 seconds b/l LE. Palpable pedal pulses b/l LE. Digital hair present b/l. No pedal edema b/l. Skin temperature gradient WNL b/l. No varicosities b/l. Marland Kitchen  Dermatological Examination: Pedal skin with normal turgor, texture and tone b/l. No open wounds. No interdigital macerations b/l. Toenails 1-5 b/l well maintained with adequate length. No erythema, no edema, no drainage, no fluctuance. Porokeratotic lesion(s) submet head 2 b/l and submet head 3 b/l. No erythema, no edema, no drainage, no fluctuance..  Neurological Examination: Protective sensation intact with 10 gram monofilament b/l LE. Vibratory sensation intact b/l LE.   Musculoskeletal Examination: Muscle strength 5/5 to all lower extremity muscle groups bilaterally. Plantarflexed metatarsal(s) 2nd metatarsal head b/l feet and 3rd metatarsal head b/l feet. She is having pain of 2nd MPJ consistent with capsulitis vs metatarsalgia.      Latest Ref Rng & Units 08/04/2022    5:12 AM  Hemoglobin A1C  Hemoglobin-A1c 4.8 - 5.6 % 4.9    Assessment/Plan: 1. Porokeratosis   2. Pain in both feet     -Consent given for treatment as described below: -Examined patient. -Recommended Hoka Bondi sneakers for additional cushioning of forefoot. -Continue supportive shoe gear daily. -Porokeratotic lesion(s) submet head 2 b/l and submet head 3 b/l pared and enucleated with sterile currette without incident. Total number of lesions debrided=4. -Patient referred to Dr. Gala Lewandowsky for evaluation of capsulitis vs metatarsalgia 2nd MPJ. -Patient/POA to call should there be question/concern in the interim.   Return in about 9 weeks (around 03/23/2023).  Freddie Breech, DPM

## 2023-01-30 DIAGNOSIS — R0989 Other specified symptoms and signs involving the circulatory and respiratory systems: Secondary | ICD-10-CM | POA: Diagnosis not present

## 2023-01-30 DIAGNOSIS — F419 Anxiety disorder, unspecified: Secondary | ICD-10-CM | POA: Diagnosis not present

## 2023-02-15 ENCOUNTER — Ambulatory Visit (INDEPENDENT_AMBULATORY_CARE_PROVIDER_SITE_OTHER): Payer: Medicare HMO

## 2023-02-15 ENCOUNTER — Encounter: Payer: Self-pay | Admitting: Podiatry

## 2023-02-15 ENCOUNTER — Ambulatory Visit: Payer: Medicare HMO | Admitting: Podiatry

## 2023-02-15 VITALS — Ht 62.0 in | Wt 147.0 lb

## 2023-02-15 DIAGNOSIS — M778 Other enthesopathies, not elsewhere classified: Secondary | ICD-10-CM

## 2023-02-15 DIAGNOSIS — M7741 Metatarsalgia, right foot: Secondary | ICD-10-CM

## 2023-02-15 DIAGNOSIS — M7742 Metatarsalgia, left foot: Secondary | ICD-10-CM

## 2023-02-15 NOTE — Progress Notes (Signed)
   Chief Complaint  Patient presents with   Foot Pain     presents to clinic today for painful porokeratotic lesions of both feet. Pain prevent(s) comfortable ambulation patient states left hurts worse than right at this time    HPI: 77 y.o. female presenting today for evaluation of pain and tenderness associated to the bilateral forefoot.  She comes in routinely for routine footcare and was recommended to come in for x-rays and further evaluation with me.  She has had pain and tenderness to the bilateral forefoot for several months and years.  She has symptomatic calluses that also developed.  Past Medical History:  Diagnosis Date   Allergy    Arthritis    Asthma    Cancer (HCC)    skin   Depression    Thyroid disease     Past Surgical History:  Procedure Laterality Date   ANKLE FRACTURE SURGERY     COLONOSCOPY  01/20/2015   FINGER SURGERY     HAND SURGERY     right hand   TONSILLECTOMY      Allergies  Allergen Reactions   Amphetamine Sulfate     Other Reaction(s): tics   Cetirizine Itching   Latex Itching    Burning, itching, pain   Methylphenidate     Other Reaction(s): tics   Pineapple Itching   Surgical Lubricant     Other Reaction(s): abd pain and irritation     Physical Exam: General: The patient is alert and oriented x3 in no acute distress.  Dermatology: Skin is warm, dry and supple bilateral lower extremities.   Vascular: Palpable pedal pulses bilaterally. Capillary refill within normal limits.  No appreciable edema.  No erythema.  Neurological: Grossly intact via light touch  Musculoskeletal Exam: Prominent metatarsal heads noted to the bilateral forefoot with associated tenderness to palpation throughout the entire forefoot consistent with metatarsalgia  Radiographic Exam B/L feet 02/15/2023:  Normal osseous mineralization.  Moderate degenerative changes noted to the bilateral feet consistent with DJD.  There is some periarticular spurring also to  the first MTP.  No acute fractures identified.  Assessment/Plan of Care: 1.  Metatarsalgia bilateral forefoot  -Patient evaluated.  X-rays reviewed -For now I do recommend conservative treatment.  I explained to the patient that the best treatment would be to alleviate pressure from the forefoot which would include arch supports and metatarsal padding. -Recommend OTC arch supports available at the shoe market unless [employer] -Metatarsal pads were applied to the current insoles in the patient's shoes if she felt significant relief and alleviation of pressure from the forefoot -Return to clinic in 6 weeks.  Patient has an appointment in 3 months for routine footcare with Dr. Vallery Sa, DPM [facility]  Dr. Felecia Shelling, DPM    [address] [place], [place] [address]                Office [phone]  Fax [phone]

## 2023-03-06 DIAGNOSIS — H6120 Impacted cerumen, unspecified ear: Secondary | ICD-10-CM | POA: Diagnosis not present

## 2023-03-06 DIAGNOSIS — H9192 Unspecified hearing loss, left ear: Secondary | ICD-10-CM | POA: Diagnosis not present

## 2023-03-08 DIAGNOSIS — C44329 Squamous cell carcinoma of skin of other parts of face: Secondary | ICD-10-CM | POA: Diagnosis not present

## 2023-03-08 DIAGNOSIS — L821 Other seborrheic keratosis: Secondary | ICD-10-CM | POA: Diagnosis not present

## 2023-03-15 ENCOUNTER — Encounter: Payer: Self-pay | Admitting: Podiatry

## 2023-03-15 ENCOUNTER — Ambulatory Visit: Payer: Medicare HMO | Admitting: Podiatry

## 2023-03-15 VITALS — Ht 62.0 in | Wt 147.0 lb

## 2023-03-15 DIAGNOSIS — M79672 Pain in left foot: Secondary | ICD-10-CM | POA: Diagnosis not present

## 2023-03-15 DIAGNOSIS — Q828 Other specified congenital malformations of skin: Secondary | ICD-10-CM | POA: Diagnosis not present

## 2023-03-15 DIAGNOSIS — M79671 Pain in right foot: Secondary | ICD-10-CM

## 2023-03-15 NOTE — Progress Notes (Signed)
  Subjective:  Patient ID: Sheri Ray, female    DOB: 08/24/1945,  MRN: [id number]  77 y.o. female presents to clinic with  painful porokeratotic lesions of both feet. Pain prevent(s) comfortable ambulation. Aggravating factor is weightbearing with and without shoegear.   She did see Dr. Logan Bores for foot pain and he placed a metatarsal pad in her shoes. She was also sent to [employer] for new shoe gear. She is working on getting a new pair of shoes/inserts.  Patient states she hasn't had much energy lately and has been feeling pretty tired. Chief Complaint  Patient presents with   Nail Problem    Pt is here for RFC not a diabetic PCP is Dr Clelia Croft LOV as last month   New problem(s): None   PCP is Lupita Raider, MD.  Allergies  Allergen Reactions   Amphetamine Sulfate     Other Reaction(s): tics   Cetirizine Itching   Latex Itching    Burning, itching, pain   Methylphenidate     Other Reaction(s): tics   Pineapple Itching   Surgical Lubricant     Other Reaction(s): abd pain and irritation    Review of Systems: Negative except as noted in the HPI.   Objective:  Sheri Ray is a pleasant 77 y.o. female WD, WN in NAD.Marland Kitchen AAO x 3.  Vascular Examination: Vascular status intact b/l with palpable pedal pulses. CFT immediate b/l. No edema. No pain with calf compression b/l. Skin temperature gradient WNL b/l.   Neurological Examination: Sensation grossly intact b/l with 10 gram monofilament. Vibratory sensation intact b/l.   Dermatological Examination: Pedal skin with normal turgor, texture and tone b/l.  No open wounds b/l LE. No interdigital macerations noted b/l LE. Toenails 1-5 b/l well maintained with adequate length. No erythema, no edema, no drainage, no fluctuance. Porokeratotic lesion(s) submet head 2 b/l and submet head 3 b/l. No erythema, no edema, no drainage, no fluctuance.  Musculoskeletal Examination: Muscle strength 5/5 to b/l LE. Plantarflexed  metatarsal(s) 2nd metatarsal head b/l feet and 3rd metatarsal head of both feet.  Radiographs: None  Last A1c:      Latest Ref Rng & Units 08/04/2022    5:12 AM  Hemoglobin A1C  Hemoglobin-A1c 4.8 - 5.6 % 4.9      Assessment:   1. Porokeratosis   2. Pain in both feet    Plan:  -Patient was evaluated today. All questions/concerns addressed on today's visit. -We discussed her loss of energy and feeling tired most days now. She is usually an energetic person. I recommended she see her PCP for a work up. She agreed and will make an appointment. -Porokeratotic lesion(s) submet head 2 right foot and submet head 3 b/l pared and enucleated with sterile currette without incident. Total number of lesions debrided=4. -Patient/POA to call should there be question/concern in the interim.  Return in about 3 months (around 06/13/2023).  Freddie Breech, DPM       LOCATION: [address] [place] [address]                   Office [phone]   [facility] LOCATION: [address] [place], [place] [address] Office [phone]

## 2023-03-23 DIAGNOSIS — Z1231 Encounter for screening mammogram for malignant neoplasm of breast: Secondary | ICD-10-CM | POA: Diagnosis not present

## 2023-04-03 ENCOUNTER — Ambulatory Visit: Payer: Medicare HMO | Admitting: Podiatry

## 2023-04-11 DIAGNOSIS — N952 Postmenopausal atrophic vaginitis: Secondary | ICD-10-CM | POA: Diagnosis not present

## 2023-04-11 DIAGNOSIS — N9419 Other specified dyspareunia: Secondary | ICD-10-CM | POA: Diagnosis not present

## 2023-04-11 DIAGNOSIS — N94819 Vulvodynia, unspecified: Secondary | ICD-10-CM | POA: Diagnosis not present

## 2023-04-12 DIAGNOSIS — K644 Residual hemorrhoidal skin tags: Secondary | ICD-10-CM | POA: Diagnosis not present

## 2023-04-12 DIAGNOSIS — K642 Third degree hemorrhoids: Secondary | ICD-10-CM | POA: Diagnosis not present

## 2023-05-03 DIAGNOSIS — N952 Postmenopausal atrophic vaginitis: Secondary | ICD-10-CM | POA: Diagnosis not present

## 2023-05-03 DIAGNOSIS — N94819 Vulvodynia, unspecified: Secondary | ICD-10-CM | POA: Diagnosis not present

## 2023-05-17 DIAGNOSIS — K644 Residual hemorrhoidal skin tags: Secondary | ICD-10-CM | POA: Diagnosis not present

## 2023-05-17 DIAGNOSIS — K642 Third degree hemorrhoids: Secondary | ICD-10-CM | POA: Diagnosis not present

## 2023-06-15 ENCOUNTER — Ambulatory Visit: Payer: Medicare HMO | Admitting: Podiatry

## 2023-06-15 ENCOUNTER — Encounter: Payer: Self-pay | Admitting: Podiatry

## 2023-06-15 VITALS — Ht 62.0 in | Wt 147.0 lb

## 2023-06-15 DIAGNOSIS — Q828 Other specified congenital malformations of skin: Secondary | ICD-10-CM

## 2023-06-15 DIAGNOSIS — M79672 Pain in left foot: Secondary | ICD-10-CM | POA: Diagnosis not present

## 2023-06-15 DIAGNOSIS — M79671 Pain in right foot: Secondary | ICD-10-CM

## 2023-06-22 ENCOUNTER — Encounter: Payer: Self-pay | Admitting: Podiatry

## 2023-06-22 NOTE — Progress Notes (Signed)
  Subjective:  Patient ID: Sheri Ray, female    DOB: 06-Jun-1945,  MRN: [id number]  Chief Complaint  Patient presents with   Painful porokeratotic lesions of both feet    Pt is here for [facility] PCP is Dr Clelia Croft and LOV was in December.    78 y.o. female presents with painful porokeratotic lesions b/l feet. Pain prevent(s) comfortable ambulation. Aggravating factor is weightbearing with and without shoegear.Marland Kitchen    PCP: Lupita Raider, MD.  Review of Systems: Negative except as noted in the HPI.   Allergies  Allergen Reactions   Amphetamine Sulfate     Other Reaction(s): tics   Cetirizine Itching   Latex Itching    Burning, itching, pain   Methylphenidate     Other Reaction(s): tics   Pineapple Itching   Surgical Lubricant     Other Reaction(s): abd pain and irritation    Objective:  There were no vitals filed for this visit. Constitutional Patient is a pleasant 78 y.o. female WD, WN in NAD. AAO x 3.  Vascular Capillary fill time to digits immediate b/l.  DP/PT pulse(s) are palpable b/l lower extremities. Pedal hair sparse. Lower extremity skin temperature gradient within normal limits. No pain with calf compression b/l. No edema noted b/l lower extremities. No cyanosis or clubbing noted.   Neurologic Protective sensation intact 5/5 intact bilaterally with 10g monofilament b/l. Vibratory sensation intact b/l. No clonus b/l.   Dermatologic Pedal skin is warm and supple b/l.  No open wounds b/l lower extremities. No interdigital macerations b/l lower extremities. Porokeratotic lesion(s) submet head 2 b/l and submet head 3 b/l. No erythema, no edema, no drainage, no fluctuance.  Orthopedic: Normal muscle strength 5/5 to all lower extremity muscle groups bilaterally. Patient ambulates independent of any assistive aids. Plantarflexed metatarsal(s) 2nd metatarsal head of both feet and 3rd metatarsal head b/l feet.      Latest Ref Rng & Units 08/04/2022    5:12 AM  Hemoglobin A1C   Hemoglobin-A1c 4.8 - 5.6 % 4.9    Radiographs:  None  Assessment:   1. Porokeratosis   2. Pain in both feet   -Consent given for treatment as described below: -Examined patient. -Porokeratotic lesion(s) submet head 2 b/l and submet head 3 b/l pared and enucleated with sterile currette without incident. Total number of lesions debrided=4. -Patient/POA to call should there be question/concern in the interim.  Return in about 3 months (around 09/15/2023).  Freddie Breech, DPM      [place] LOCATION: [address], [place] [address]                   Office [phone]   [facility] LOCATION: [address] [place], [place] [address] Office [phone]

## 2023-07-13 DIAGNOSIS — N94819 Vulvodynia, unspecified: Secondary | ICD-10-CM | POA: Diagnosis not present

## 2023-07-13 DIAGNOSIS — N952 Postmenopausal atrophic vaginitis: Secondary | ICD-10-CM | POA: Diagnosis not present

## 2023-08-07 ENCOUNTER — Ambulatory Visit (HOSPITAL_COMMUNITY): Payer: Medicare HMO | Attending: Cardiology

## 2023-08-07 DIAGNOSIS — E785 Hyperlipidemia, unspecified: Secondary | ICD-10-CM | POA: Diagnosis not present

## 2023-08-07 DIAGNOSIS — I10 Essential (primary) hypertension: Secondary | ICD-10-CM | POA: Diagnosis not present

## 2023-08-07 DIAGNOSIS — I77810 Thoracic aortic ectasia: Secondary | ICD-10-CM | POA: Diagnosis not present

## 2023-08-07 DIAGNOSIS — I517 Cardiomegaly: Secondary | ICD-10-CM | POA: Diagnosis not present

## 2023-08-07 DIAGNOSIS — R079 Chest pain, unspecified: Secondary | ICD-10-CM | POA: Insufficient documentation

## 2023-08-07 DIAGNOSIS — I080 Rheumatic disorders of both mitral and aortic valves: Secondary | ICD-10-CM

## 2023-08-07 DIAGNOSIS — I351 Nonrheumatic aortic (valve) insufficiency: Secondary | ICD-10-CM | POA: Insufficient documentation

## 2023-08-07 LAB — ECHOCARDIOGRAM COMPLETE
Area-P 1/2: 3.72 cm2
MV M vel: 4.77 m/s
MV Peak grad: 91 mmHg
P 1/2 time: 475 ms
Radius: 0.6 cm
S' Lateral: 2.6 cm

## 2023-08-08 ENCOUNTER — Ambulatory Visit: Payer: Self-pay | Admitting: Cardiology

## 2023-08-09 ENCOUNTER — Telehealth: Payer: Self-pay | Admitting: Cardiology

## 2023-08-09 NOTE — Telephone Encounter (Signed)
 Patient is returning call to discuss echo results.

## 2023-08-09 NOTE — Telephone Encounter (Signed)
 Me    08/09/23 11:42 AM Result Note The patient has been notified of the result and verbalized understanding.  All questions (if any) were answered. Peggi Bowels, LPN 1/61/0960 45:40 AM ECHOCARDIOGRAM COMPLETE Me    08/08/23  6:03 PM Result Note Left message for the pt to call back for results. ECHOCARDIOGRAM COMPLETE Leala Prince, PA-C to Me    08/08/23 10:07 AM Result Note Echocardiogram shows normal heart pumping function (both left and right side). Stable, mild dilation of ascending aorta (unchanged from prior ultrasound). Regarding your aortic valve, in some views, there does appear to be a little more aortic insufficiency (valve not closing properly) in comparison to prior ultrasound. Sometimes we use MRI to further assess this but depending on how you're feeling, we may also choose to continue monitoring with periodic ultrasounds. I've reached out to Dr. Filiberto Hug and he plans to discuss further with you at your upcoming appointment.

## 2023-08-22 ENCOUNTER — Other Ambulatory Visit (HOSPITAL_COMMUNITY): Payer: Self-pay

## 2023-08-22 ENCOUNTER — Encounter: Payer: Self-pay | Admitting: Cardiology

## 2023-08-22 ENCOUNTER — Ambulatory Visit: Attending: Cardiology | Admitting: Cardiology

## 2023-08-22 VITALS — BP 120/88 | HR 62 | Resp 16 | Ht 62.0 in | Wt 143.4 lb

## 2023-08-22 DIAGNOSIS — I351 Nonrheumatic aortic (valve) insufficiency: Secondary | ICD-10-CM

## 2023-08-22 DIAGNOSIS — I700 Atherosclerosis of aorta: Secondary | ICD-10-CM | POA: Diagnosis not present

## 2023-08-22 DIAGNOSIS — E782 Mixed hyperlipidemia: Secondary | ICD-10-CM

## 2023-08-22 DIAGNOSIS — I10 Essential (primary) hypertension: Secondary | ICD-10-CM | POA: Diagnosis not present

## 2023-08-22 DIAGNOSIS — I77810 Thoracic aortic ectasia: Secondary | ICD-10-CM

## 2023-08-22 MED ORDER — ROSUVASTATIN CALCIUM 10 MG PO TABS
10.0000 mg | ORAL_TABLET | Freq: Every day | ORAL | 1 refills | Status: DC
  Filled 2023-08-22: qty 90, 90d supply, fill #0

## 2023-08-22 NOTE — Progress Notes (Signed)
 Cardiology Office Note:  .   Date:  08/22/2023  ID:  Sheri Ray, DOB [birth year], MRN [id number] PCP: Glena Landau, MD  [facility] Providers Cardiologist:  Fransico Ivy, MD PCP: Glena Landau, MD  Chief Complaint  Patient presents with   Aortic regurgitation     Sheri Ray is a 78 y.o. female with hypertension, hyperlipidemia, moderate AI, mild MR, dilated ascending aorta at 38 mm, depression, asthma, pulmonary nodule  History of Present Illness  Patient was previously seen by other providers, there is an office meeting with her.  Patient recently underwent echocardiogram in 07/2023, details discussed with the patient below.  She still exercises 3 times a week without any significant difficulty.  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  Patient had a suspected NSTEMI in 07/2022, but CTA coronary showed no significant CAD, calcium  score was 0.  There was some aortic atherosclerosis noted.  Her pain was very focal under her left breast, not related to exertion.     Vitals:   08/22/23 1507  BP: 120/88  Pulse: 62  Resp: 16  SpO2: 99%      Review of Systems  Cardiovascular:  Negative for chest pain, dyspnea on exertion, leg swelling, palpitations and syncope.        Studies Reviewed: Aaron Aas       EKG 08/22/2023: Normal sinus rhythm Inferior infarct , age undetermined Cannot rule out Anterior infarct , age undetermined When compared with ECG of 03-Aug-2022 19:47, No significant change was found  CT chest 10/2022, coronary CT 07/2022: 1. Pulmonary nodules measure up to 8 mm in the right middle lobe, unchanged from 05/06/2022. CT in 1 year is considered optional for low-risk patients, but is recommended for high-risk patients. This recommendation follows the consensus statement: Guidelines for Management of Incidental Pulmonary Nodules Detected on CT Images: From the Fleischner Society 2017; Radiology 2017;  284:228-243. 2. 4.0 cm ascending aortic aneurysm. Recommend annual imaging followup by CTA or MRA. This recommendation follows 2010 ACCF/AHA/AATS/ACR/ASA/SCA/SCAI/SIR/STS/SVM Guidelines for the Diagnosis and Management of Patients with Thoracic Aortic Disease. Circulation. 2010; 121: W098-J191. Aortic aneurysm NOS (ICD10-I71.9). 3. Cholelithiasis. 4.  Aortic atherosclerosis (ICD10-I70.0). 5. No coronary calcification  Labs 03/2023: Chol 195, TG 45, HDL 62, LDL 124 HbA1C 5.1% Hb 13.3 Cr 0.69 TSH 1.3  Echocardiogram 07/2023: Moderate asymmetric hypertrophy of basal septal segment.  LVEF 60 to 65%.  Normal diastolic function. Normal RV systolic function. Mild MR. Mild dilation of ascending aorta 39 mm. Trileaflet aortic valve with moderate regurgitation.  Recommend cardiac MRI to quantify    Physical Exam Vitals and nursing note reviewed.  Constitutional:      General: She is not in acute distress. Neck:     Vascular: No JVD.  Cardiovascular:     Rate and Rhythm: Normal rate and regular rhythm.     Heart sounds: Murmur heard.     High-pitched blowing decrescendo early diastolic murmur is present with a grade of 2/4 at the upper right sternal border radiating to the apex.  Pulmonary:     Effort: Pulmonary effort is normal.     Breath sounds: Normal breath sounds. No wheezing or rales.  Musculoskeletal:     Right lower leg: No edema.     Left lower leg: No edema.      VISIT DIAGNOSES:   ICD-10-CM   1. Essential hypertension  I10 EKG 12-Lead    2. Nonrheumatic aortic valve insufficiency  I35.1 ECHOCARDIOGRAM COMPLETE  3. Mixed hyperlipidemia  E78.2     4. Ascending aorta dilatation (HCC)  I77.810     5. Aortic atherosclerosis (HCC)  I70.0        [name] is a 78 y.o. female with hypertension, hyperlipidemia, moderate AI, mild MR, dilated ascending aorta at 38 mm, depression, asthma, pulmonary nodule Assessment & Plan  Aortic regurgitation: I  personally reviewed and compared echocardiogram between 07/2022, and 07/2023.  There is no significant change in the severity of her aortic regurgitation.  LV size i and EF are normal, patient is asymptomatic.  I do not think cardiac MRI is necessary at this point.  Will repeat echocardiogram in 1 year.  Dilated ascending aorta: Mildly dilated ascending aorta noted at 38 mm.  This will also be evaluated on once a year CT scan for surveillance of her lung nodule, as well as repeat echocardiogram in 1 year for surveillance of aortic regurgitation.  Aortic atherosclerosis: LDL 124.  Recommend Crestor  10 mg daily.  Patient has follow-up with PCP in July where lipid panel can be checked.  If LDL not reduced <70, could increase Crestor  to 20 mg daily.    Meds ordered this encounter  Medications   rosuvastatin  (CRESTOR ) 10 MG tablet    Sig: Take 1 tablet (10 mg total) by mouth daily.    Dispense:  90 tablet    Refill:  1     F/u in 1 year  Signed, Cody Das, MD

## 2023-08-22 NOTE — Patient Instructions (Signed)
 Medication Instructions:  START Crestor  10 mg daily   *If you need a refill on your cardiac medications before your next appointment, please call your pharmacy*  Testing/Procedures: Echo in 1 year   Your physician has requested that you have an echocardiogram. Echocardiography is a painless test that uses sound waves to create images of your heart. It provides your doctor with information about the size and shape of your heart and how well your heart's chambers and valves are working. This procedure takes approximately one hour. There are no restrictions for this procedure. Please do NOT wear cologne, perfume, aftershave, or lotions (deodorant is allowed). Please arrive 15 minutes prior to your appointment time.  Please note: We ask at that you not bring children with you during ultrasound (echo/ vascular) testing. Due to room size and safety concerns, children are not allowed in the ultrasound rooms during exams. Our front office staff cannot provide observation of children in our lobby area while testing is being conducted. An adult accompanying a patient to their appointment will only be allowed in the ultrasound room at the discretion of the ultrasound technician under special circumstances. We apologize for any inconvenience.   Follow-Up: At [facility], you and your health needs are our priority.  As part of our continuing mission to provide you with exceptional heart care, our providers are all part of one team.  This team includes your primary Cardiologist (physician) and Advanced Practice Providers or APPs (Physician Assistants and Nurse Practitioners) who all work together to provide you with the care you need, when you need it.  Your next appointment:   1 year(s)  Provider:   Cody Das, MD    We recommend signing up for the patient portal called "MyChart".  Sign up information is provided on this After Visit Summary.  MyChart is used to connect with patients for  Virtual Visits (Telemedicine).  Patients are able to view lab/test results, encounter notes, upcoming appointments, etc.  Non-urgent messages can be sent to your provider as well.   To learn more about what you can do with MyChart, go to [email or web address].

## 2023-08-29 DIAGNOSIS — J988 Other specified respiratory disorders: Secondary | ICD-10-CM | POA: Diagnosis not present

## 2023-09-18 ENCOUNTER — Ambulatory Visit: Admitting: Podiatry

## 2023-09-18 ENCOUNTER — Encounter: Payer: Self-pay | Admitting: Podiatry

## 2023-09-18 DIAGNOSIS — M79672 Pain in left foot: Secondary | ICD-10-CM

## 2023-09-18 DIAGNOSIS — K439 Ventral hernia without obstruction or gangrene: Secondary | ICD-10-CM | POA: Insufficient documentation

## 2023-09-18 DIAGNOSIS — M79671 Pain in right foot: Secondary | ICD-10-CM

## 2023-09-18 DIAGNOSIS — F900 Attention-deficit hyperactivity disorder, predominantly inattentive type: Secondary | ICD-10-CM | POA: Insufficient documentation

## 2023-09-18 DIAGNOSIS — Q828 Other specified congenital malformations of skin: Secondary | ICD-10-CM | POA: Diagnosis not present

## 2023-09-23 ENCOUNTER — Encounter: Payer: Self-pay | Admitting: Podiatry

## 2023-09-23 NOTE — Progress Notes (Signed)
  Subjective:  Patient ID: Sheri Ray, female    DOB: 1945/11/03,  MRN: [id number]  78 y.o. female presents to clinic with  painful porokeratotic lesions of both feet. Pain prevent(s) comfortable ambulation. Aggravating factor is weightbearing with and without shoegear.  Chief Complaint  Patient presents with   RFC    Rm1 RFC not daibetic DR. Loreli last visit Dec 2024/ callouses only     New problem(s): None   PCP is Loreli Kins, MD.  Allergies  Allergen Reactions   Amphetamine Sulfate     Other Reaction(s): tics   Cetirizine Itching   Latex Itching    Burning, itching, pain   Methylphenidate     Other Reaction(s): tics   Nonoxynol 9     Other Reaction(s): abd pain and irritation   Pineapple Itching   Surgical Lubricant     Other Reaction(s): abd pain and irritation    Review of Systems: Negative except as noted in the HPI.   Objective:  Sheri Ray is a pleasant 78 y.o. female WD, WN in NAD. AAO x 3.  Vascular Examination: Vascular status intact b/l with palpable pedal pulses. CFT immediate b/l. No edema. No pain with calf compression b/l. Skin temperature gradient WNL b/l. Pedal hair sparse. No cyanosis or clubbing noted b/l LE.  Neurological Examination: Sensation grossly intact b/l with 10 gram monofilament. Vibratory sensation intact b/l.   Dermatological Examination: Pedal skin with normal turgor, texture and tone b/l. Porokeratotic lesion(s) submet head 2 left foot, submet head 2 right foot, submet head 3 left foot, and submet head 3 right foot. No erythema, no edema, no drainage, no fluctuance.  Musculoskeletal Examination: Muscle strength 5/5 to b/l LE. Plantarflexed metatarsal(s) 2nd metatarsal head of both feet and 3rd metatarsal head of both feet.  Radiographs: None  Last A1c:       No data to display           Assessment:   1. Porokeratosis   2. Pain in both feet    Plan:  Patient was evaluated and treated. All patient's  and/or POA's questions/concerns addressed on today's visit.  Porokeratotic lesion(s) submet head 2 left foot, submet head 2 right foot, submet head 3 left foot, and submet head 3 right foot pared with sharp debridement without incident. Continue soft, supportive shoe gear daily. Report any pedal injuries to medical professional. Call office if there are any questions/concerns.Return in about 3 months (around 12/19/2023).  Delon LITTIE Merlin, DPM      [facility] LOCATION: [address] [place] [address]                   Office [phone]   [facility] LOCATION: [address] [place], [place] [address] Office [phone]

## 2023-10-09 ENCOUNTER — Encounter: Payer: Self-pay | Admitting: Family Medicine

## 2023-10-09 DIAGNOSIS — E782 Mixed hyperlipidemia: Secondary | ICD-10-CM | POA: Diagnosis not present

## 2023-10-09 DIAGNOSIS — E039 Hypothyroidism, unspecified: Secondary | ICD-10-CM | POA: Diagnosis not present

## 2023-10-12 ENCOUNTER — Ambulatory Visit: Payer: Self-pay | Admitting: Cardiology

## 2023-10-12 NOTE — Progress Notes (Signed)
 Lipids much improved. Continue current statin dose.  Thanks MJP

## 2023-10-13 NOTE — Progress Notes (Signed)
 Pt has been made aware of normal result and verbalized understanding.  jw

## 2023-11-02 ENCOUNTER — Ambulatory Visit
Admission: RE | Admit: 2023-11-02 | Discharge: 2023-11-02 | Disposition: A | Discharge status: Home / Self Care | Source: Ambulatory Visit | Attending: Emergency Medicine | Admitting: Emergency Medicine

## 2023-11-02 DIAGNOSIS — R918 Other nonspecific abnormal finding of lung field: Secondary | ICD-10-CM | POA: Diagnosis not present

## 2023-11-02 DIAGNOSIS — J9811 Atelectasis: Secondary | ICD-10-CM | POA: Diagnosis not present

## 2023-11-02 DIAGNOSIS — R911 Solitary pulmonary nodule: Secondary | ICD-10-CM

## 2023-11-03 DIAGNOSIS — H35373 Puckering of macula, bilateral: Secondary | ICD-10-CM | POA: Diagnosis not present

## 2023-11-07 ENCOUNTER — Other Ambulatory Visit (HOSPITAL_COMMUNITY): Payer: Self-pay

## 2023-11-23 ENCOUNTER — Encounter: Payer: Self-pay | Admitting: Podiatry

## 2023-11-23 ENCOUNTER — Ambulatory Visit: Admitting: Podiatry

## 2023-11-23 DIAGNOSIS — M79672 Pain in left foot: Secondary | ICD-10-CM

## 2023-11-23 DIAGNOSIS — M79671 Pain in right foot: Secondary | ICD-10-CM

## 2023-11-25 NOTE — Progress Notes (Signed)
  Subjective:  Patient ID: Sheri Ray, female    DOB: 1945/09/29,  MRN: [id number]  Sheri Ray presents to clinic today for painful porokeratotic lesions of both feet. Pain prevent(s) comfortable ambulation. Aggravating factor is weightbearing with and without shoegear. Patient states she has gotten a bill and has not had this issue before. Chief Complaint  Patient presents with   RFC    Rm1 Routine foot care/ Dr. Suzen Gentry last visit    New problem(s): None.   PCP is Gentry Kins, MD.  Allergies  Allergen Reactions   Amphetamine Sulfate     Other Reaction(s): tics   Cetirizine Itching   Latex Itching    Burning, itching, pain   Methylphenidate     Other Reaction(s): tics   Nonoxynol 9     Other Reaction(s): abd pain and irritation   Pineapple Itching   Surgical Lubricant     Other Reaction(s): abd pain and irritation    Review of Systems: Negative except as noted in the HPI.  Objective: No changes noted in today's physical examination. There were no vitals filed for this visit. Sheri Ray is a pleasant 78 y.o. female WD, WN in NAD. AAO x 3.  Vascular Examination: Vascular status intact b/l with palpable pedal pulses. CFT immediate b/l. No edema. No pain with calf compression b/l. Skin temperature gradient WNL b/l. Pedal hair sparse. No cyanosis or clubbing noted b/l LE.  Neurological Examination: Sensation grossly intact b/l with 10 gram monofilament. Vibratory sensation intact b/l.   Dermatological Examination: Pedal skin is warm and supple b/l LE. No open wounds b/l LE. No interdigital macerations noted b/l LE. Toenails 1-5 b/l well maintained with adequate length. No erythema, no edema, no drainage, no fluctuance.  Pedal skin with normal turgor, texture and tone b/l. Porokeratotic lesion(s) submet head 2 left foot, submet head 2 right foot, submet head 3 left foot, and submet head 3 right foot. No erythema, no edema, no drainage, no  fluctuance.  Musculoskeletal Examination: Muscle strength 5/5 to b/l LE. Plantarflexed metatarsal(s) 2nd metatarsal head of both feet and 3rd metatarsal head of both feet.  Radiographs: None Assessment/Plan: 1. Pain in both feet    Consent given for treatment. Patient examined. Will communicate with billing dept for bill patient received.  All patient's and/or POA's questions/concerns addressed on today's visit. Obtain precert for CPT 11056 for painful porokeratoses. As a courtesy, porokeratotic lesion(s) submet head 2 b/l and submet head 3 b/l pared and enucleated with sharp debridement without incident. Continue soft, supportive shoe gear daily. Report any pedal injuries to medical professional. Call office if there are any questions/concerns.  Return in about 9 weeks (around 01/25/2024).  Delon LITTIE Merlin, DPM      [place] LOCATION: [address], [place] [address]                   Office [phone]   [facility] LOCATION: [address] [place], [place] [address] Office [phone]

## 2023-12-11 DIAGNOSIS — Z961 Presence of intraocular lens: Secondary | ICD-10-CM | POA: Diagnosis not present

## 2023-12-11 DIAGNOSIS — H26491 Other secondary cataract, right eye: Secondary | ICD-10-CM | POA: Diagnosis not present

## 2024-01-15 DIAGNOSIS — L814 Other melanin hyperpigmentation: Secondary | ICD-10-CM | POA: Diagnosis not present

## 2024-01-15 DIAGNOSIS — D1801 Hemangioma of skin and subcutaneous tissue: Secondary | ICD-10-CM | POA: Diagnosis not present

## 2024-01-15 DIAGNOSIS — L570 Actinic keratosis: Secondary | ICD-10-CM | POA: Diagnosis not present

## 2024-01-15 DIAGNOSIS — N94819 Vulvodynia, unspecified: Secondary | ICD-10-CM | POA: Diagnosis not present

## 2024-01-15 DIAGNOSIS — Z85828 Personal history of other malignant neoplasm of skin: Secondary | ICD-10-CM | POA: Diagnosis not present

## 2024-01-15 DIAGNOSIS — N952 Postmenopausal atrophic vaginitis: Secondary | ICD-10-CM | POA: Diagnosis not present

## 2024-01-15 DIAGNOSIS — L821 Other seborrheic keratosis: Secondary | ICD-10-CM | POA: Diagnosis not present

## 2024-02-05 ENCOUNTER — Other Ambulatory Visit: Payer: Self-pay | Admitting: Cardiology

## 2024-02-07 DIAGNOSIS — K644 Residual hemorrhoidal skin tags: Secondary | ICD-10-CM | POA: Diagnosis not present

## 2024-02-07 DIAGNOSIS — K642 Third degree hemorrhoids: Secondary | ICD-10-CM | POA: Diagnosis not present

## 2024-02-29 ENCOUNTER — Ambulatory Visit: Admitting: Podiatry

## 2024-04-05 LAB — LAB REPORT - SCANNED
A1c: 5.3
EGFR: 83

## 2024-04-11 ENCOUNTER — Ambulatory Visit: Admitting: Emergency Medicine

## 2024-04-11 ENCOUNTER — Encounter: Payer: Self-pay | Admitting: Emergency Medicine

## 2024-04-11 VITALS — BP 104/68 | HR 61 | Ht 61.5 in | Wt 138.2 lb

## 2024-04-11 DIAGNOSIS — R911 Solitary pulmonary nodule: Secondary | ICD-10-CM

## 2024-04-11 DIAGNOSIS — J309 Allergic rhinitis, unspecified: Secondary | ICD-10-CM | POA: Diagnosis not present

## 2024-04-11 DIAGNOSIS — J301 Allergic rhinitis due to pollen: Secondary | ICD-10-CM

## 2024-04-11 NOTE — Patient Instructions (Signed)
 We reviewed your CT scan of the chest from 10/2023.  This showed that your pulmonary nodules are stable in size and appearance.  You should not need any further CT scans to follow these nodules.  Good news. You could try increasing your fluticasone nasal spray to 2 sprays each nostril every day on a schedule to see if this helps with drainage. You could also try starting over-the-counter loratadine 10 mg (generic Claritin) once daily to see if this helps with your drainage and cough. Follow with Dr. Shelah if needed for any respiratory problems.

## 2024-04-11 NOTE — Progress Notes (Signed)
 "  Subjective:    Patient ID: Sheri Ray, female    DOB: [birth year], 79 y.o.   MRN: [id number]  HPI   ROV 04/11/2024 --follow-up visit for 79 year old woman who is a never smoker.  She has a history of asthma, allergic rhinitis.  I seen her for this as well as pulmonary nodules that were noted on CT scan of the chest 07/2022, then stable in 10/2022 She reports today that she feels well. She does have some cough associated w some nasal gtt. She uses flonase most days. She sings, teaches music. Has to use cough drops. She clears her throat freq. Not feeling any GERD.   CT scan of the chest 11/02/2023 reviewed by me shows unchanged 8 mm right middle lobe nodule with some associated bandlike scarring, unchanged 4 mm right lower lobe nodule, also unchanged tiny clustered nodules at the right apex.  There is some stable lingular and left basilar scarring   Review of Systems As per HPI  Past Medical History:  Diagnosis Date   Allergy    Arthritis    Asthma    Cancer (HCC)    skin   Depression    Thyroid  disease      Family History  Problem Relation Age of Onset   Colon cancer Neg Hx    Colon polyps Neg Hx    Esophageal cancer Neg Hx    Rectal cancer Neg Hx    Stomach cancer Neg Hx     No family hx lung CA  Social History   Socioeconomic History   Marital status: Legally Separated    Spouse name: Not on file   Number of children: Not on file   Years of education: Not on file   Highest education level: Not on file  Occupational History   Not on file  Tobacco Use   Smoking status: Never   Smokeless tobacco: Never  Vaping Use   Vaping status: Never Used  Substance and Sexual Activity   Alcohol use: Yes    Alcohol/week: 0.0 standard drinks of alcohol    Comment: occ. wine per pt   Drug use: No   Sexual activity: Not on file  Other Topics Concern   Not on file  Social History Narrative   Not on file   Social Drivers of Health   Tobacco Use: Low Risk (04/11/2024)    Patient History    Smoking Tobacco Use: Never    Smokeless Tobacco Use: Never    Passive Exposure: Not on file  Financial Resource Strain: Not on file  Food Insecurity: Not on file  Transportation Needs: Not on file  Physical Activity: Not on file  Stress: Not on file  Social Connections: Not on file  Intimate Partner Violence: Not on file  Depression (EYV7-0): Not on file  Alcohol Screen: Not on file  Housing: Unknown (04/12/2023)   Received from [facility]   Epic    Unable to Pay for Housing in the Last Year: Not on file    Number of Times Moved in the Last Year: Not on file    At any time in the past 12 months, were you homeless or living in a shelter (including now)?: No  Utilities: Not on file  Health Literacy: Not on file    Has been a runner, broadcasting/film/video, no known mold/asbestos Lived in [place] for a year No known TB exposure or positive testing.    Allergies  Allergen Reactions   Amphetamine Sulfate  Other Reaction(s): tics   Cetirizine Itching   Latex Itching    Burning, itching, pain   Methylphenidate     Other Reaction(s): tics   Nonoxynol 9     Other Reaction(s): abd pain and irritation   Pineapple Itching   Surgical Lubricant     Other Reaction(s): abd pain and irritation     Outpatient Medications Prior to Visit  Medication Sig Dispense Refill   acetaminophen  (TYLENOL ) 500 MG tablet Take 500 mg by mouth every 6 (six) hours as needed for mild pain.     Calcium -Vitamin D 600-200 MG-UNIT per tablet Take 1 tablet by mouth 2 (two) times daily.     citalopram  (CELEXA ) 40 MG tablet Take 40 mg by mouth daily.     Cyanocobalamin (VITAMIN B 12) 500 MCG TABS Take 500 mcg by mouth daily.     diclofenac  (VOLTAREN ) 50 MG EC tablet TAKE 1 TABLET BY MOUTH TWICE A DAY 60 tablet 0   nitroGLYCERIN  (NITROSTAT ) 0.4 MG SL tablet Place 1 tablet (0.4 mg total) under the tongue every 5 (five) minutes as needed for chest pain. 30 tablet 0   olmesartan (BENICAR) 20 MG  tablet Take 20 mg by mouth daily.     Omega-3 Fatty Acids (FISH OIL) 1200 MG CAPS Take 1,200 mg by mouth daily.     PREMARIN vaginal cream Place 1 applicator vaginally once a week.     rosuvastatin  (CRESTOR ) 10 MG tablet TAKE 1 TABLET BY MOUTH EVERY DAY 90 tablet 2   Theanine 100 MG CAPS Take 1 capsule by mouth daily.     thyroid  (ARMOUR THYROID ) 60 MG tablet Take 30-60 mg by mouth See admin instructions. Take 1/2 tablet by mouth every Monday and 1 tablet all other days     zinc gluconate 50 MG tablet Take 50 mg by mouth daily.     No facility-administered medications prior to visit.         Objective:   Physical Exam  Vitals:   04/11/24 0920  BP: 104/68  Pulse: 61  SpO2: 97%  Weight: 138 lb 3.2 oz (62.7 kg)  Height: 5' 1.5 (1.562 m)   Gen: Pleasant, well-nourished, in no distress,  normal affect  ENT: No lesions,  mouth clear,  oropharynx clear, no postnasal drip  Neck: No JVD, no stridor  Lungs: No use of accessory muscles, no crackles or wheezing on normal respiration, no wheeze on forced expiration  Cardiovascular: RRR, heart sounds normal, no murmur or gallops, no peripheral edema  Musculoskeletal: No deformities, no cyanosis or clubbing  Neuro: alert, awake, non focal  Skin: Warm, no lesions or rash    Assessment & Plan:   Pulmonary nodule 1 cm or greater in diameter Her pulmonary nodules have been stable from 06/15/2022 to 11/02/2023.  She is a low risk patient, no tobacco history, no family history.  Should not need to follow any further.  Reassured her about this.  Allergic rhinitis Significant daily drainage and mucus burden that is contributing to cough.  She has Flonase that she uses some days.  I encouraged her to start trying to use it every day to see if she gets benefit.  Also recommended that she try starting over-the-counter loratadine to see if this helps.     Lamar Chris, MD, PhD 04/11/2024, 9:55 AM [facility] [phone] or if no answer before 7:00PM call [phone] For any issues after 7:00PM please call eLink [phone]  "

## 2024-04-11 NOTE — Assessment & Plan Note (Signed)
 Significant daily drainage and mucus burden that is contributing to cough.  She has Flonase that she uses some days.  I encouraged her to start trying to use it every day to see if she gets benefit.  Also recommended that she try starting over-the-counter loratadine to see if this helps.

## 2024-04-11 NOTE — Assessment & Plan Note (Signed)
 Her pulmonary nodules have been stable from 06/15/2022 to 11/02/2023.  She is a low risk patient, no tobacco history, no family history.  Should not need to follow any further.  Reassured her about this.

## 2024-04-19 ENCOUNTER — Ambulatory Visit: Admitting: Podiatry

## 2024-04-19 ENCOUNTER — Encounter: Payer: Self-pay | Admitting: Podiatry

## 2024-04-19 DIAGNOSIS — M79672 Pain in left foot: Secondary | ICD-10-CM

## 2024-04-19 DIAGNOSIS — M79671 Pain in right foot: Secondary | ICD-10-CM

## 2024-04-24 NOTE — Progress Notes (Signed)
"  °  Subjective:  Patient ID: Sheri Ray, female    DOB: [birth year],  MRN: [id number]  Sheri Ray presents to clinic today for painful porokeratotic lesions b/l feet. Pain prevent(s) comfortable ambulation. Aggravating factor is weightbearing with and without shoegear.  Chief Complaint  Patient presents with   Nail Problem    She denies being diabetic. She saw Dr. Loreli this week   New problem(s): None.   PCP is Loreli Kins, MD.  Allergies[1]  Review of Systems: Negative except as noted in the HPI.  Objective: No changes noted in today's physical examination. There were no vitals filed for this visit. Sheri Ray is a pleasant 79 y.o. female WD, WN in NAD. AAO x 3.  Vascular Examination: Vascular status intact b/l with palpable pedal pulses. CFT immediate b/l. No edema. No pain with calf compression b/l. Skin temperature gradient WNL b/l. Pedal hair sparse. No cyanosis or clubbing noted b/l LE.  Neurological Examination: Sensation grossly intact b/l with 10 gram monofilament. Vibratory sensation intact b/l.   Dermatological Examination: Pedal skin is warm and supple b/l LE. No open wounds b/l LE. No interdigital macerations noted b/l LE. Toenails 1-5 b/l well maintained with adequate length. No erythema, no edema, no drainage, no fluctuance.  Pedal skin with normal turgor, texture and tone b/l. Porokeratotic lesion(s) submet head 2 left foot, submet head 2 right foot, submet head 3 left foot, and submet head 3 right foot. No erythema, no edema, no drainage, no fluctuance.  Musculoskeletal Examination: Muscle strength 5/5 to b/l LE. Plantarflexed metatarsal(s) 2nd metatarsal head of both feet and 3rd metatarsal head of both feet.  Assessment/Plan: 1. Pain in both feet   Patient was evaluated and treated. All patient's and/or POA's questions/concerns addressed on today's visit. As a courtesy, porokeratotic lesion(s) submet head 2 b/l and submet head 3 b/l pared  with sharp debridement without incident. Continue soft, supportive shoe gear daily. Report any pedal injuries to medical professional. Call office if there are any questions/concerns.  Return in about 3 months (around 07/18/2024).  Delon LITTIE Merlin, DPM      [place] LOCATION: [address], [place] [address]                   Office [phone]   [place] LOCATION: [address] [place], [place] [address] Office [phone]      [1]  Allergies Allergen Reactions   Amphetamine Sulfate     Other Reaction(s): tics   Cetirizine Itching   Latex Itching    Burning, itching, pain   Methylphenidate     Other Reaction(s): tics   Nonoxynol 9     Other Reaction(s): abd pain and irritation   Pineapple Itching   Surgical Lubricant     Other Reaction(s): abd pain and irritation   "

## 2024-07-19 ENCOUNTER — Ambulatory Visit: Admitting: Podiatry
